# Patient Record
Sex: Male | Born: 1995 | Race: White | Hispanic: No | Marital: Single | Smoking: Never smoker
Health system: Southern US, Community
[De-identification: ages and names within clinical notes are randomized; demographics above are authoritative.]

## PROBLEM LIST (undated history)

## (undated) DIAGNOSIS — F32A Depression, unspecified: Secondary | ICD-10-CM

## (undated) DIAGNOSIS — F329 Major depressive disorder, single episode, unspecified: Secondary | ICD-10-CM

## (undated) HISTORY — DX: Depression, unspecified: F32.A

## (undated) HISTORY — DX: Major depressive disorder, single episode, unspecified: F32.9

---
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
## 2015-06-11 IMAGING — US US ABDOMEN LIMITED SLG ORGAN/ASCITES
1 series · 14 of 25 positions shown · non-contrast
Comparison: none

REASON FOR EXAM: URQ  elevated LFTs
COMMENTS:

[Series 1: us abdomen limited slg organ/ascites · 0.28mm/px · 14 of 69 slices shown]
[im 1/69]
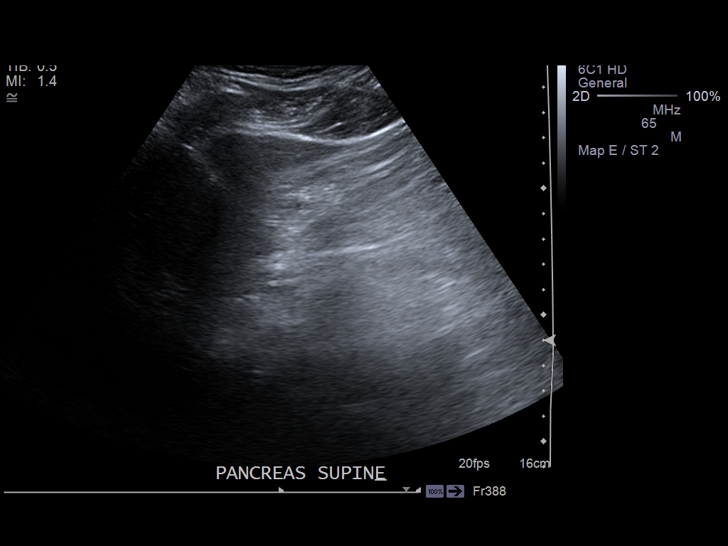
[im 6/69]
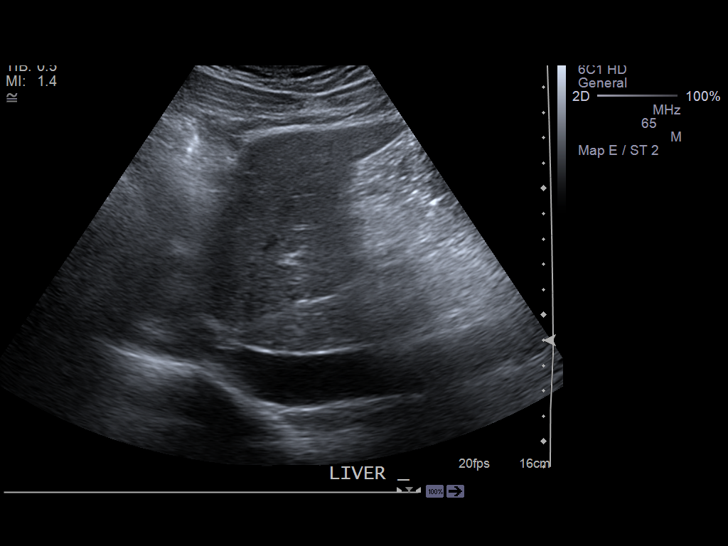
[im 12/69]
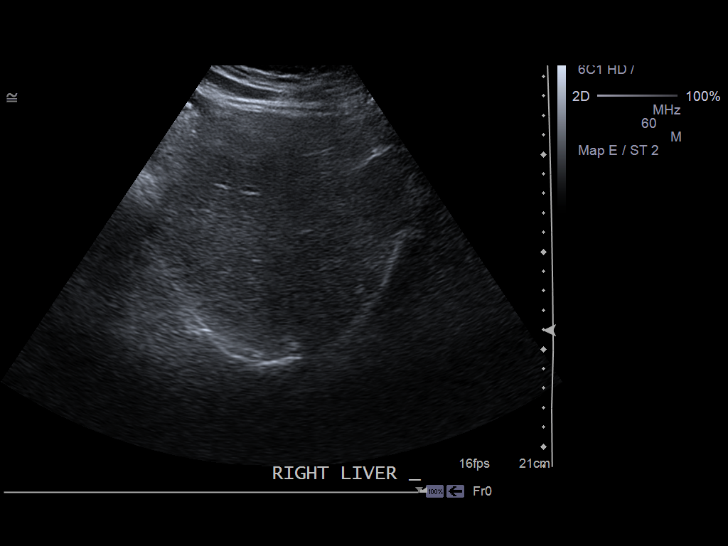
[im 18/69]
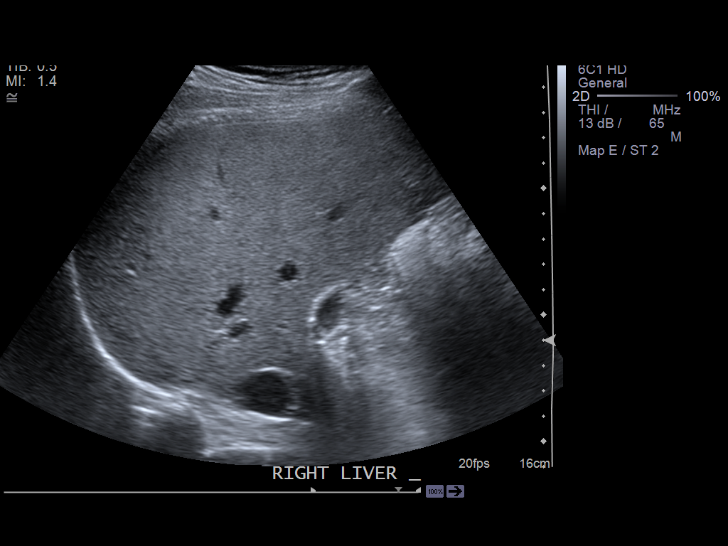
[im 23/69]
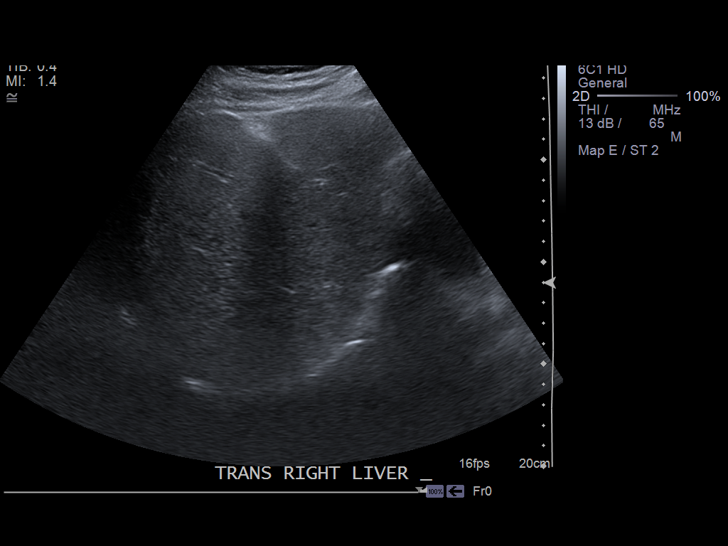
[im 26/69]
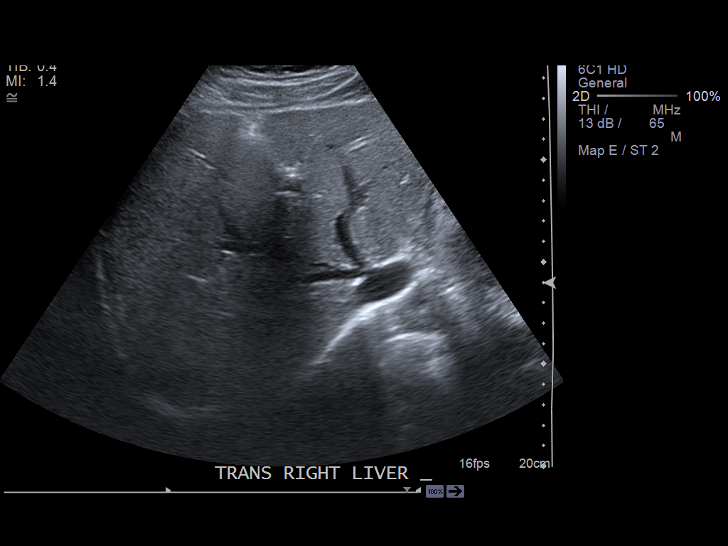
[im 32/69]
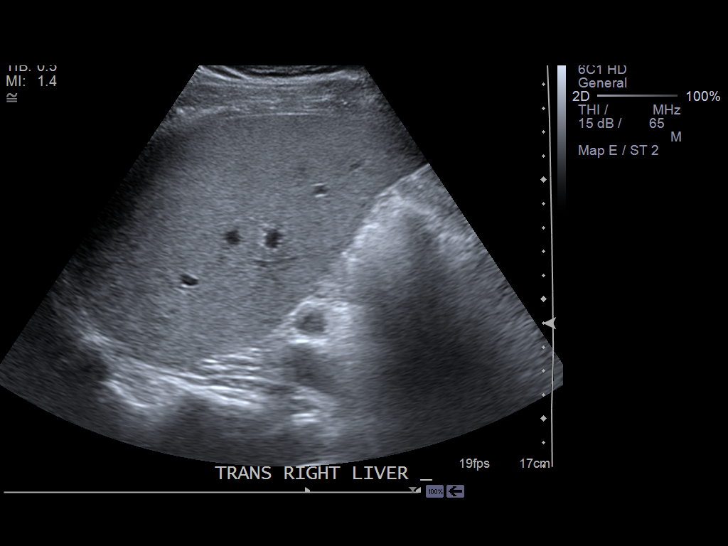
[im 37/69]
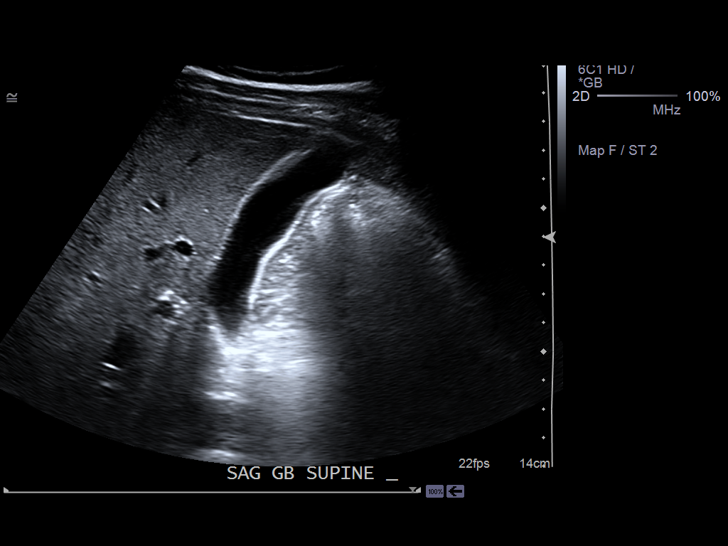
[im 43/69]
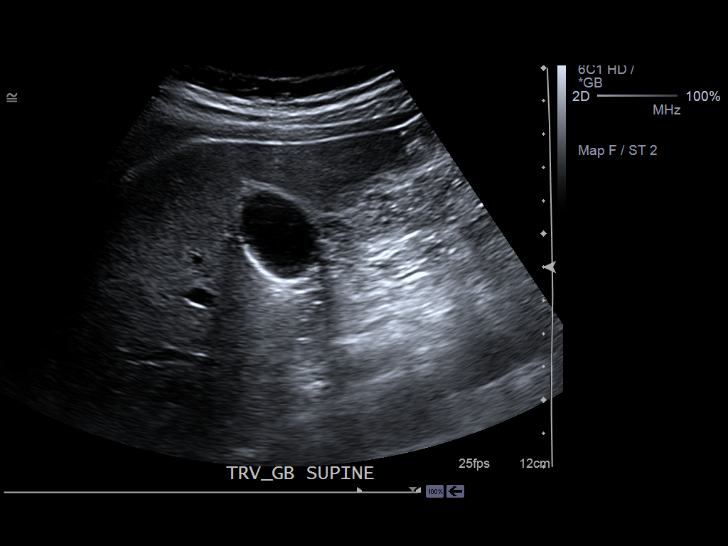
[im 46/69]
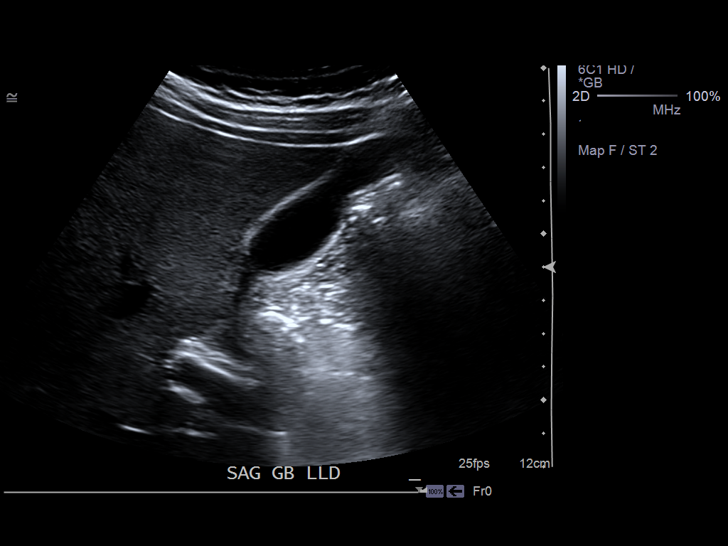
[im 52/69]
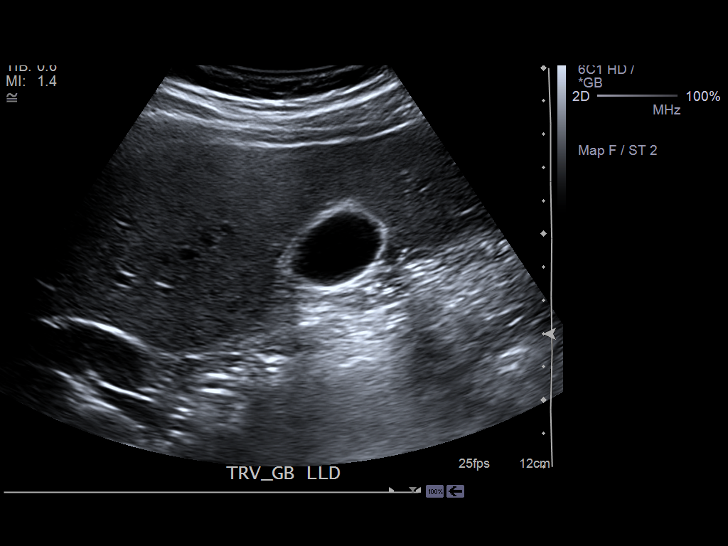
[im 57/69]
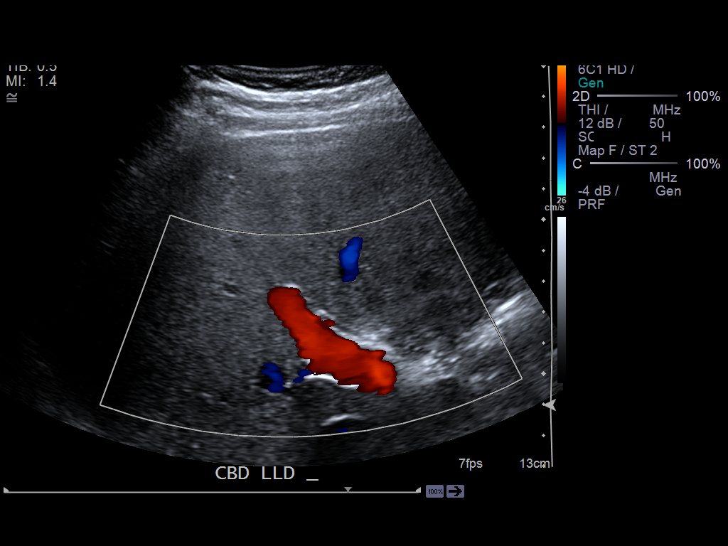
[im 63/69]
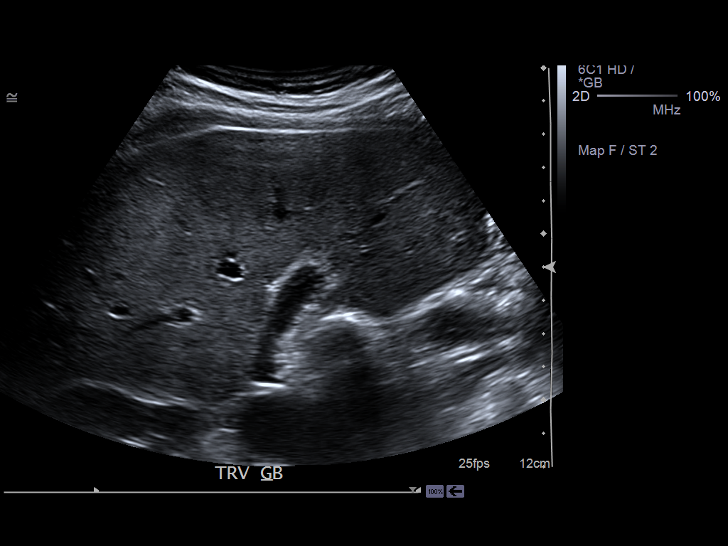
[im 69/69]
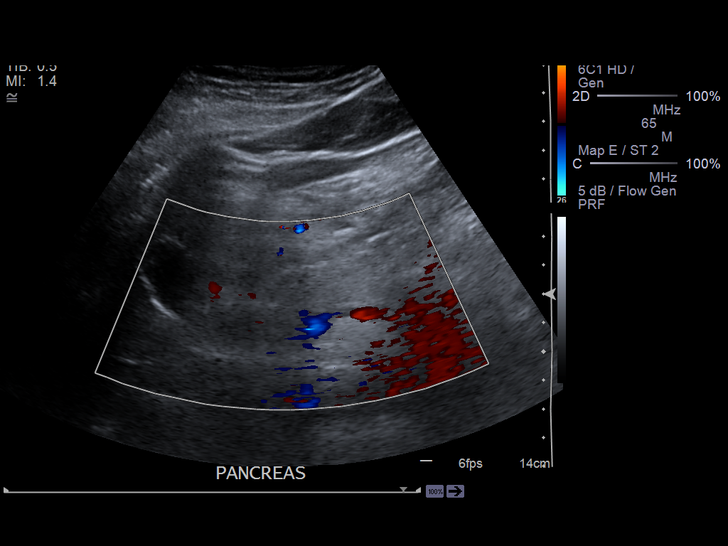

[14 of 25 positions shown; findings below may reference images not displayed]

PROCEDURE:     CEEJAY - CEEJAY ABDOMEN LTD 1 ORGAN OR QUAD  - December 10, 2012  [DATE]

RESULT:     The liver exhibits normal echotexture with no focal mass or
ductal dilation. Portal venous flow is normal in direction toward the liver.
The pancreas was largely obscured by bowel gas. The gallbladder is
adequately distended with no evidence of stones, wall thickening, or
pericholecystic fluid. There is no positive sonographic Murphy's sign.
IMPRESSION: Evaluation the pancreas is limited. Liver, gallbladder, and
common bile duct are normal in appearance.

[REDACTED]

## 2017-03-17 ENCOUNTER — Ambulatory Visit: Payer: Self-pay | Admitting: Family Medicine

## 2017-03-24 ENCOUNTER — Ambulatory Visit: Payer: Self-pay | Admitting: Family Medicine

## 2017-03-27 ENCOUNTER — Ambulatory Visit: Payer: Self-pay | Admitting: Family Medicine

## 2017-04-11 ENCOUNTER — Ambulatory Visit (INDEPENDENT_AMBULATORY_CARE_PROVIDER_SITE_OTHER): Payer: BC Managed Care – PPO | Admitting: Family Medicine

## 2017-04-11 ENCOUNTER — Encounter: Payer: Self-pay | Admitting: Family Medicine

## 2017-04-11 VITALS — BP 133/84 | HR 75 | Temp 98.6°F | Ht 70.0 in | Wt 244.2 lb

## 2017-04-11 DIAGNOSIS — F3130 Bipolar disorder, current episode depressed, mild or moderate severity, unspecified: Secondary | ICD-10-CM

## 2017-04-11 DIAGNOSIS — F319 Bipolar disorder, unspecified: Secondary | ICD-10-CM

## 2017-04-11 DIAGNOSIS — Z7689 Persons encountering health services in other specified circumstances: Secondary | ICD-10-CM

## 2017-04-11 MED ORDER — LAMOTRIGINE 25 MG PO TABS: 25.0000 mg | ORAL_TABLET | Freq: Every day | ORAL | 0 refills | Status: DC

## 2017-04-11 MED ORDER — SERTRALINE HCL 50 MG PO TABS: 50.0000 mg | ORAL_TABLET | Freq: Every day | ORAL | 1 refills | Status: DC

## 2017-04-11 NOTE — Progress Notes (Signed)
BP 133/84 (BP Location: Right Arm, Patient Position: Sitting, Cuff Size: Large)   Pulse 75   Temp 98.6 F (37 C)   Ht 5\' 10"  (1.778 m)   Wt 244 lb 3.2 oz (110.8 kg)   SpO2 98%   BMI 35.04 kg/m    Subjective:    Patient ID: Ricky Duke, male    DOB: 15-Sep-1995, 21 y.o.   MRN: [id number]  HPI: Ricky Duke is a 21 y.o. male  Chief Complaint  Patient presents with  . Establish Care  . Depression    Patient wants to be put back on Zoloft and Lamictal. Filling out PHQ9.   Patient presents today to establish care. Only known medical condition is some bipolar depression that he was diagnosed with in college. Provider at his university had him on zoloft and lamictal and he notes that he did very well on this regimen. Has been off for about a year since graduation as he moved out of state and had not found a new provider. Since being off, has had frequent intense mood swings with long periods of dysphoric moods and anhedonia. No SI/HI, hallucinations, sleep or appetite issues.  Last CPE was several years ago.   Does have strong fhx of bipolar disorder and major depression in several family members.   Relevant past medical, surgical, family and social history reviewed and updated as indicated. Interim medical history since our last visit reviewed. Allergies and medications reviewed and updated.  Review of Systems  Constitutional: Negative.   HENT: Negative.   Respiratory: Negative.   Cardiovascular: Negative.   Gastrointestinal: Negative.   Genitourinary: Negative.   Musculoskeletal: Negative.   Neurological: Negative.   Psychiatric/Behavioral: Positive for dysphoric mood. The patient is hyperactive.    Per HPI unless specifically indicated above     Objective:    BP 133/84 (BP Location: Right Arm, Patient Position: Sitting, Cuff Size: Large)   Pulse 75   Temp 98.6 F (37 C)   Ht 5\' 10"  (1.778 m)   Wt 244 lb 3.2 oz (110.8 kg)   SpO2 98%   BMI 35.04 kg/m   Wt  Readings from Last 3 Encounters:  04/11/17 244 lb 3.2 oz (110.8 kg)    Physical Exam  Constitutional: He is oriented to person, place, and time. He appears well-developed and well-nourished. No distress.  HENT:  Head: Atraumatic.  Eyes: Pupils are equal, round, and reactive to light. Conjunctivae are normal.  Neck: Normal range of motion. Neck supple.  Cardiovascular: Normal rate and normal heart sounds.   Pulmonary/Chest: Effort normal and breath sounds normal. No respiratory distress.  Musculoskeletal: Normal range of motion.  Neurological: He is alert and oriented to person, place, and time.  Skin: Skin is warm and dry.  Psychiatric: He has a normal mood and affect. His behavior is normal. Judgment and thought content normal.  Nursing note and vitals reviewed.   Results for orders placed or performed in visit on 03/12/17  [facility] SCREENING LAB  Result Value Ref Range   [facility] HIV Screening Negative - Validated   [facility] HEPATITIS C SCREENING LAB  Result Value Ref Range   [facility] Hepatitis Screen Negative-Validated       Assessment & Plan:   Problem List Items Addressed This Visit      Other   Bipolar depression (HCC) - Primary    Will restart zoloft and lamictal and slowly titrate doses for max benefit. List of counselors given, strongly recommended also establishing with  one of them. Discussed what to do if feeling urges to harm self or others, including ER, Crisis Center hotlines, [facility] walk-in care. Follow up in 1 month for recheck       Other Visit Diagnoses    Encounter to establish care           Follow up plan: Return in about 4 weeks (around 05/09/2017) for Depression f/u.

## 2017-04-11 NOTE — Assessment & Plan Note (Signed)
Will restart zoloft and lamictal and slowly titrate doses for max benefit. List of counselors given, strongly recommended also establishing with one of them. Discussed what to do if feeling urges to harm self or others, including ER, Crisis Center hotlines, [facility] walk-in care. Follow up in 1 month for recheck

## 2017-04-11 NOTE — Patient Instructions (Signed)
Follow up in 1 month   

## 2017-05-09 ENCOUNTER — Other Ambulatory Visit: Payer: Self-pay

## 2017-05-09 ENCOUNTER — Ambulatory Visit: Payer: BC Managed Care – PPO | Admitting: Family Medicine

## 2017-05-09 ENCOUNTER — Encounter: Payer: Self-pay | Admitting: Family Medicine

## 2017-05-09 VITALS — BP 130/88 | HR 83 | Temp 98.3°F | Wt 244.0 lb

## 2017-05-09 DIAGNOSIS — F319 Bipolar disorder, unspecified: Secondary | ICD-10-CM

## 2017-05-09 DIAGNOSIS — J069 Acute upper respiratory infection, unspecified: Secondary | ICD-10-CM | POA: Diagnosis not present

## 2017-05-09 DIAGNOSIS — F3130 Bipolar disorder, current episode depressed, mild or moderate severity, unspecified: Secondary | ICD-10-CM

## 2017-05-09 DIAGNOSIS — B9789 Other viral agents as the cause of diseases classified elsewhere: Secondary | ICD-10-CM

## 2017-05-09 MED ORDER — SERTRALINE HCL 100 MG PO TABS: 100.0000 mg | ORAL_TABLET | Freq: Every day | ORAL | 3 refills | Status: DC

## 2017-05-09 MED ORDER — LAMOTRIGINE 25 MG PO TABS: 25.0000 mg | ORAL_TABLET | Freq: Two times a day (BID) | ORAL | 0 refills | Status: DC

## 2017-05-09 NOTE — Patient Instructions (Signed)
Follow up in 1 month   

## 2017-05-09 NOTE — Assessment & Plan Note (Signed)
Will increase lamictal to 25 mg BID and zoloft to 100 mg QD and monitor closely for adverse effects. Recheck in 1 month to assess dosing

## 2017-05-09 NOTE — Progress Notes (Addendum)
   BP 130/88   Pulse 83   Temp 98.3 F (36.8 C)   Wt 244 lb (110.7 kg)   SpO2 97%   BMI 35.01 kg/m    Subjective:    Patient ID: Ricky Duke, male    DOB: 1995/07/06, 21 y.o.   MRN: [id number]  HPI: Ricky Jarvisicholas Nuno is a 21 y.o. male  Chief Complaint  Patient presents with  . Depression    follow up, doing well with his meds   Patient presents today for bipolar depression follow up. Was restarted on zoloft and lamictal at low doses last month as he's noticed major mood swings and depressive periods since being off medications. Has not yet titrated to twice daily on the lamictal, still taking 25 mg daily. Doing well, no side effects noted but still having some less severe mood swings. Denies SI/HI, hallucinations, manic behavior.   Also c/o 2-3 days of cold sxs including rhinorrhea, sore throat, and dry cough. Has not been trying anything OTC at this time. Denies wheezing, CP, SOB, fevers, body aches. Several sick contacts.   Relevant past medical, surgical, family and social history reviewed and updated as indicated. Interim medical history since our last visit reviewed. Allergies and medications reviewed and updated.  Review of Systems  Constitutional: Negative.   HENT: Negative.   Respiratory: Negative.   Cardiovascular: Negative.   Gastrointestinal: Negative.   Genitourinary: Negative.   Musculoskeletal: Negative.   Neurological: Negative.   Psychiatric/Behavioral: Positive for agitation and dysphoric mood.    Per HPI unless specifically indicated above     Objective:    BP 130/88   Pulse 83   Temp 98.3 F (36.8 C)   Wt 244 lb (110.7 kg)   SpO2 97%   BMI 35.01 kg/m   Wt Readings from Last 3 Encounters:  05/09/17 244 lb (110.7 kg)  04/11/17 244 lb 3.2 oz (110.8 kg)    Physical Exam  Constitutional: He is oriented to person, place, and time. He appears well-developed and well-nourished.  HENT:  Head: Atraumatic.  Eyes: Conjunctivae are normal. Pupils  are equal, round, and reactive to light.  Neck: Normal range of motion. Neck supple.  Cardiovascular: Normal rate and normal heart sounds.  Pulmonary/Chest: Effort normal and breath sounds normal. No respiratory distress.  Musculoskeletal: Normal range of motion.  Neurological: He is alert and oriented to person, place, and time.  Skin: Skin is warm and dry.  Psychiatric: He has a normal mood and affect. His behavior is normal.  Vitals reviewed.     Assessment & Plan:   Problem List Items Addressed This Visit      Other   Bipolar depression (HCC) - Primary    Will increase lamictal to 25 mg BID and zoloft to 100 mg QD and monitor closely for adverse effects. Recheck in 1 month to assess dosing       Other Visit Diagnoses    Viral URI with cough       OTC remedies and supportive care reviewed. Follow up if worsening or no improvement over the course of the next week       Follow up plan: Return in about 4 weeks (around 06/06/2017) for Depression f/u.

## 2017-05-10 NOTE — Addendum Note (Signed)
Addended by: Roosvelt MaserLANE, Mikel Hardgrove E on: 05/10/2017 11:06 AM   Modules accepted: Level of Service

## 2017-05-22 ENCOUNTER — Telehealth: Payer: Self-pay | Admitting: Family Medicine

## 2017-05-22 NOTE — Telephone Encounter (Signed)
Copied from CRM [id number]. Topic: Quick Communication - See Telephone Encounter >> May 22, 2017  1:57 PM Terisa Starraylor, Brittany L wrote: CRM for notification. See Telephone encounter for:  Pt states that the LAMICTAL and ZOLOFT is not working for him. Patient requested that his dr called him back [phone]  05/22/17.

## 2017-05-23 MED ORDER — OLANZAPINE 5 MG PO TABS: 5.0000 mg | ORAL_TABLET | Freq: Every day | ORAL | 0 refills | Status: DC

## 2017-05-23 NOTE — Telephone Encounter (Signed)
Message given to taper off Zoloft and start Zyprexa as prescribed. Pt to keep 06/06/17 appt.

## 2017-05-23 NOTE — Telephone Encounter (Signed)
LVM for patient to return phone call. [facility]

## 2017-05-23 NOTE — Telephone Encounter (Signed)
Please call pt and let him know I have sent over a different medicine (zyprexa) for him to take with the lamictal - can slowly taper off the zoloft (cut in half, then after a few days start every other day, so on). He can reschedule his upcoming appt for 1 month from start of new medicine so we can have a better idea of efficacy or he can keep that upcoming appt as a check in

## 2017-06-06 ENCOUNTER — Ambulatory Visit: Payer: BC Managed Care – PPO | Admitting: Family Medicine

## 2017-06-06 DIAGNOSIS — F3130 Bipolar disorder, current episode depressed, mild or moderate severity, unspecified: Secondary | ICD-10-CM

## 2017-06-06 DIAGNOSIS — F319 Bipolar disorder, unspecified: Secondary | ICD-10-CM

## 2017-06-06 MED ORDER — OLANZAPINE 10 MG PO TABS: 10.0000 mg | ORAL_TABLET | Freq: Every day | ORAL | 0 refills | Status: DC

## 2017-06-06 MED ORDER — LAMOTRIGINE 100 MG PO TABS: 50.0000 mg | ORAL_TABLET | Freq: Two times a day (BID) | ORAL | 0 refills | Status: DC

## 2017-06-06 NOTE — Progress Notes (Signed)
   There were no vitals taken for this visit.   Subjective:    Patient ID: Ricky Duke, male    DOB: 08-30-1995, 21 y.o.   MRN: [id number]  HPI: Ricky Jarvisicholas Bouch is a 21 y.o. male  Chief Complaint  Patient presents with  . Follow-up  . mood   Patient here for f/u bipolar depression. Tapering off zoloft as no benefit and started lamictal and zyprexa about 2 weeks ago as directed. No side effects noted, maybe some mild improvement but still very much having quick agitation and mood swings. No SI/HI, does not feel he is a danger just is exhausted of the moods.   Relevant past medical, surgical, family and social history reviewed and updated as indicated. Interim medical history since our last visit reviewed. Allergies and medications reviewed and updated.  Review of Systems  Constitutional: Negative.   HENT: Negative.   Respiratory: Negative.   Cardiovascular: Negative.   Gastrointestinal: Negative.   Musculoskeletal: Negative.   Neurological: Negative.   Psychiatric/Behavioral: Positive for agitation and dysphoric mood. The patient is hyperactive.    Per HPI unless specifically indicated above     Objective:    There were no vitals taken for this visit.  Wt Readings from Last 3 Encounters:  05/09/17 244 lb (110.7 kg)  04/11/17 244 lb 3.2 oz (110.8 kg)    Physical Exam  Constitutional: He is oriented to person, place, and time. He appears well-developed and well-nourished. No distress.  HENT:  Head: Atraumatic.  Eyes: Conjunctivae are normal. Pupils are equal, round, and reactive to light. No scleral icterus.  Neck: Normal range of motion. Neck supple.  Cardiovascular: Normal rate and normal heart sounds.  Pulmonary/Chest: Effort normal and breath sounds normal. No respiratory distress.  Musculoskeletal: Normal range of motion.  Lymphadenopathy:    He has no cervical adenopathy.  Neurological: He is alert and oriented to person, place, and time.  Skin: Skin is warm  and dry.  Psychiatric: Judgment and thought content normal.  Flat affect  Nursing note and vitals reviewed.     Assessment & Plan:   Problem List Items Addressed This Visit      Other   Bipolar depression (HCC) - Primary    Will increase both lamictal and zyprexa and monitor closely for benefit. Pt still not interested in counseling. Continue taper off zoloft, is nearly completely off of the medication now. F/u in 1 month, sooner if needed          Follow up plan: Return in about 4 weeks (around 07/04/2017) for Mood f/u.

## 2017-06-08 ENCOUNTER — Encounter: Payer: Self-pay | Admitting: Family Medicine

## 2017-06-08 NOTE — Patient Instructions (Signed)
Follow up as needed

## 2017-06-08 NOTE — Assessment & Plan Note (Signed)
Will increase both lamictal and zyprexa and monitor closely for benefit. Pt still not interested in counseling. Continue taper off zoloft, is nearly completely off of the medication now. F/u in 1 month, sooner if needed

## 2017-06-09 ENCOUNTER — Other Ambulatory Visit: Payer: Self-pay | Admitting: Family Medicine

## 2017-06-24 ENCOUNTER — Other Ambulatory Visit: Payer: Self-pay | Admitting: Family Medicine

## 2017-07-04 ENCOUNTER — Encounter: Payer: Self-pay | Admitting: Family Medicine

## 2017-07-04 ENCOUNTER — Ambulatory Visit: Payer: BC Managed Care – PPO | Admitting: Family Medicine

## 2017-07-04 VITALS — BP 123/80 | HR 90 | Temp 97.6°F | Wt 250.2 lb

## 2017-07-04 DIAGNOSIS — F3130 Bipolar disorder, current episode depressed, mild or moderate severity, unspecified: Secondary | ICD-10-CM | POA: Diagnosis not present

## 2017-07-04 DIAGNOSIS — F319 Bipolar disorder, unspecified: Secondary | ICD-10-CM

## 2017-07-04 MED ORDER — OLANZAPINE 10 MG PO TABS: 10.0000 mg | ORAL_TABLET | Freq: Every day | ORAL | 1 refills | Status: DC

## 2017-07-04 MED ORDER — LAMOTRIGINE 100 MG PO TABS: 100.0000 mg | ORAL_TABLET | Freq: Two times a day (BID) | ORAL | 2 refills | Status: DC

## 2017-07-04 NOTE — Progress Notes (Signed)
   BP 123/80 (BP Location: Right Arm, Patient Position: Sitting, Cuff Size: Normal)   Pulse 90   Temp 97.6 F (36.4 C) (Temporal)   Wt 250 lb 3.2 oz (113.5 kg)   SpO2 96%   BMI 35.90 kg/m    Subjective:    Patient ID: Ricky Duke, male    DOB: 01/18/1996, 22 y.o.   MRN: [id number]  HPI: Ricky Duke is a 22 y.o. male  Chief Complaint  Patient presents with  . Depression    Patient states no complaints.    Pt here for bipolar depression f/u. Lamictal and zyprexa increased at last visit. Still having some issues with agitation but does feel like things have gotten better. No SI/HI, hallucinations. Denies side effects. Still not interested in counseling of any sort, states he doesn't have time.   [facility]04/2017 06/06/2017 05/09/2017  Decreased Interest 1 2 1   Down, Depressed, Hopeless 2 2 2   PHQ - 2 Score 3 4 3   Altered sleeping 1 0 0  Tired, decreased energy 2 1 3   Change in appetite 2 0 0  Feeling bad or failure about yourself  2 2 2   Trouble concentrating 1 1 2   Moving slowly or fidgety/restless 2 3 3   Suicidal thoughts 1 2 2   PHQ-9 Score 14 13 15     Relevant past medical, surgical, family and social history reviewed and updated as indicated. Interim medical history since our last visit reviewed. Allergies and medications reviewed and updated.  Review of Systems  Constitutional: Negative.   Respiratory: Negative.   Cardiovascular: Negative.   Gastrointestinal: Negative.   Genitourinary: Negative.   Musculoskeletal: Negative.   Neurological: Negative.   Psychiatric/Behavioral: Positive for agitation.   Per HPI unless specifically indicated above     Objective:    BP 123/80 (BP Location: Right Arm, Patient Position: Sitting, Cuff Size: Normal)   Pulse 90   Temp 97.6 F (36.4 C) (Temporal)   Wt 250 lb 3.2 oz (113.5 kg)   SpO2 96%   BMI 35.90 kg/m   Wt Readings from Last 3 Encounters:  07/04/17 250 lb 3.2 oz (113.5 kg)  05/09/17  244 lb (110.7 kg)  04/11/17 244 lb 3.2 oz (110.8 kg)    Physical Exam  Constitutional: He is oriented to person, place, and time. He appears well-developed and well-nourished. No distress.  HENT:  Head: Atraumatic.  Eyes: Conjunctivae are normal. Pupils are equal, round, and reactive to light.  Neck: Normal range of motion. Neck supple.  Cardiovascular: Normal rate and normal heart sounds.  Pulmonary/Chest: Effort normal and breath sounds normal.  Abdominal: Soft. Bowel sounds are normal.  Musculoskeletal: Normal range of motion.  Neurological: He is alert and oriented to person, place, and time.  Skin: Skin is warm and dry.  Psychiatric: He has a normal mood and affect. His behavior is normal.  Nursing note and vitals reviewed.     Assessment & Plan:   Problem List Items Addressed This Visit      Other   Bipolar depression (HCC) - Primary    Improved, but pt still does not feel like things are where he wants them to be. Will increase lamictal and monitor closely for benefit. F/u in 2 months unless issues in meantime. Continue to encourage counseling.           Follow up plan: Return in about 8 weeks (around 08/29/2017) for Mood f/u.

## 2017-07-06 NOTE — Patient Instructions (Signed)
Follow up as needed

## 2017-07-06 NOTE — Assessment & Plan Note (Signed)
Improved, but pt still does not feel like things are where he wants them to be. Will increase lamictal and monitor closely for benefit. F/u in 2 months unless issues in meantime. Continue to encourage counseling.

## 2017-07-08 ENCOUNTER — Other Ambulatory Visit: Payer: Self-pay | Admitting: Family Medicine

## 2017-07-10 ENCOUNTER — Telehealth: Payer: Self-pay

## 2017-07-10 MED ORDER — LAMOTRIGINE 100 MG PO TABS: 100.0000 mg | ORAL_TABLET | Freq: Two times a day (BID) | ORAL | 1 refills | Status: DC

## 2017-07-10 NOTE — Telephone Encounter (Signed)
90 day supply request for lamotrigine 100mg .

## 2017-07-10 NOTE — Telephone Encounter (Signed)
Rx sent 

## 2017-08-29 ENCOUNTER — Telehealth: Payer: Self-pay | Admitting: Family Medicine

## 2017-08-29 MED ORDER — OLANZAPINE 10 MG PO TABS: 10.0000 mg | ORAL_TABLET | Freq: Every day | ORAL | 0 refills | Status: DC

## 2017-08-29 NOTE — Telephone Encounter (Signed)
[facility]  Olanzapine 5 mg 90 day supply  Thank you  [facility] [phone]

## 2017-08-29 NOTE — Telephone Encounter (Signed)
Pt now on 10 mg zyprexa, sent [facility] supply to [facility]

## 2017-09-02 ENCOUNTER — Encounter: Payer: Self-pay | Admitting: Family Medicine

## 2017-09-02 ENCOUNTER — Ambulatory Visit: Payer: BC Managed Care – PPO | Admitting: Family Medicine

## 2017-09-02 VITALS — BP 124/83 | HR 75 | Temp 97.7°F | Wt 242.9 lb

## 2017-09-02 DIAGNOSIS — F3130 Bipolar disorder, current episode depressed, mild or moderate severity, unspecified: Secondary | ICD-10-CM

## 2017-09-02 DIAGNOSIS — F319 Bipolar disorder, unspecified: Secondary | ICD-10-CM

## 2017-09-02 MED ORDER — FLUOXETINE HCL 20 MG PO TABS: 20.0000 mg | ORAL_TABLET | Freq: Every day | ORAL | 3 refills | Status: DC

## 2017-09-02 NOTE — Progress Notes (Signed)
BP 124/83 (BP Location: Right Arm, Patient Position: Sitting, Cuff Size: Normal)   Pulse 75   Temp 97.7 F (36.5 C) (Oral)   Wt 242 lb 14.4 oz (110.2 kg)   SpO2 96%   BMI 34.85 kg/m    Subjective:    Patient ID: Ricky Duke, male    DOB: 02/09/1996, 22 y.o.   MRN: [id number]  HPI: Ricky Duke is a 22 y.o. male  Chief Complaint  Patient presents with  . Depression    Patient states things are about the same.   Pt here today for bipolar depression/agitation f/u. Currently taking 100 mg lamictal and 5 mg zyprexa - was supposed to be on 10 mg but pharmacy has still been giving him the 5's so he has not yet started the increased dose. No side effects, but has not felt much improvement since last visit. Still getting very easily agitation but feels mood swings are under decent control and his baseline moods are a bit better than previous. Denies SI/HI, hallucinations, paranoia, sleep or appetite concerns.   Depression screen [facility] 2/9 09/02/2017 07/04/2017 06/06/2017  Decreased Interest 1 1 2   Down, Depressed, Hopeless 1 2 2   PHQ - 2 Score 2 3 4   Altered sleeping 0 1 0  Tired, decreased energy 1 2 1   Change in appetite 0 2 0  Feeling bad or failure about yourself  1 2 2   Trouble concentrating 1 1 1   Moving slowly or fidgety/restless 1 2 3   Suicidal thoughts 1 1 2   PHQ-9 Score 7 14 13     Relevant past medical, surgical, family and social history reviewed and updated as indicated. Interim medical history since our last visit reviewed. Allergies and medications reviewed and updated.  Review of Systems  Per HPI unless specifically indicated above     Objective:    BP 124/83 (BP Location: Right Arm, Patient Position: Sitting, Cuff Size: Normal)   Pulse 75   Temp 97.7 F (36.5 C) (Oral)   Wt 242 lb 14.4 oz (110.2 kg)   SpO2 96%   BMI 34.85 kg/m   Wt Readings from Last 3 Encounters:  09/02/17 242 lb 14.4 oz (110.2 kg)  07/04/17 250 lb 3.2 oz (113.5 kg)  05/09/17  244 lb (110.7 kg)    Physical Exam  Constitutional: He is oriented to person, place, and time. He appears well-developed and well-nourished. No distress.  HENT:  Head: Atraumatic.  Eyes: Pupils are equal, round, and reactive to light. Conjunctivae are normal.  Neck: Normal range of motion.  Cardiovascular: Normal rate and normal heart sounds.  Pulmonary/Chest: Effort normal and breath sounds normal.  Musculoskeletal: Normal range of motion.  Neurological: He is alert and oriented to person, place, and time.  Skin: Skin is warm and dry.  Psychiatric: He has a normal mood and affect. His behavior is normal.  Nursing note and vitals reviewed.   Results for orders placed or performed in visit on 03/12/17  [facility] SCREENING LAB  Result Value Ref Range   [facility] HIV Screening Negative - Validated   [facility] HEPATITIS C SCREENING LAB  Result Value Ref Range   HM Hepatitis Screen Negative-Validated       Assessment & Plan:   Problem List Items Addressed This Visit      Other   Bipolar depression (HCC) - Primary    Stable from previous, no improvement in agitation per patient. Will start the 10 mg zyprexa and add prozac under both current medications,  monitor closely for benefit. Again strongly encouraged pt to seek counseling, pt states he does not have time for that.           Follow up plan: Return in about 3 months (around 12/03/2017) for Mood f/u.

## 2017-09-04 NOTE — Patient Instructions (Signed)
Follow up as scheduled.  

## 2017-09-04 NOTE — Assessment & Plan Note (Signed)
Stable from previous, no improvement in agitation per patient. Will start the 10 mg zyprexa and add prozac under both current medications, monitor closely for benefit. Again strongly encouraged pt to seek counseling, pt states he does not have time for that.

## 2017-09-25 ENCOUNTER — Telehealth: Payer: Self-pay

## 2017-09-25 MED ORDER — FLUOXETINE HCL 20 MG PO TABS: 20.0000 mg | ORAL_TABLET | Freq: Every day | ORAL | 0 refills | Status: DC

## 2017-09-25 NOTE — Telephone Encounter (Signed)
Pharmacy sent a fax wanting to know if patient's fluoxetine can be changed to 90 day RX instead of 30.

## 2017-09-25 NOTE — Telephone Encounter (Signed)
[facility] [place]  Advanced Heart Failure and Transplant  Heart Transplant Clinic  Follow-up Visit    Primary Care Physician: Brodsky, Mark E  Referring Provider: Brett Justin Berman  Date of Transplant: 08/06/2019  Organ(s) Transplanted: heart  Indication for transplant: Dilated Myopathy: Idiopathic  PHS increased risk donor: Yes    ID. 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    Interval History:    The patient was last seen on 10/01/21. At that time issues with pain after urologic procedure.    He continues to deal with pain issue largely from prostate surgery. He still has some bleeding and some tissue come out. He tried different strategies and nothing helped. This is really impacting quality of life. He gets tired and frustrated and does not want to take it out on his family.    ROS:  A complete ROS was performed and is negative except as documented in the HPI.      Allergies:  Patient is allergic to cats [other] and dogs [other].    Past Medical History:   Diagnosis Date    Asthma     Atrial fibrillation (CMS-HCC)     Chronic HFrEF (heart failure with reduced ejection fraction) (CMS-HCC)     GERD (gastroesophageal reflux disease)     HTN (hypertension)     Insomnia     Nephrolithiasis     Sinusitis      Patient Active Problem List   Diagnosis    COPD (chronic obstructive pulmonary disease) (CMS-HCC)    Heart transplant, orthotopic, 08/06/2019    Pericardial effusion    Hypertension    Chronic back pain    At risk for infection transmitted from donor    Acute hepatitis C virus infection    Heart transplanted (CMS-HCC)    Acute UTI    Umbilical hernia without obstruction and without gangrene    COVID-19 virus detected    Acute medial meniscus tear of left knee, sequela    Localized osteoarthritis of left knee     Past Surgical History:   Procedure Laterality Date    CARDIAC DEFIBRILLATOR PLACEMENT       PB ANESTH,SHOULDER JOINT,NOS Right      Family History   Problem Relation Name Age of Onset    Hypertension Other      Other Maternal Grandmother          kidney disease needing HD     Social History     Socioeconomic History    Marital status: Single     Spouse name: Not on file    Number of children: Not on file    Years of education: Not on file    Highest education level: Not on file   Occupational History    Not on file   Tobacco Use    Smoking status: Never    Smokeless tobacco: Never    Tobacco comments:     from friends and relatives    Substance and Sexual Activity    Alcohol use: Not Currently     Comment: Prior heavier use, but completely quit in 2016    Drug use: Yes     Comment: eats edible marijuana for pain and insomnia     Sexual activity: Not on file   Other Topics Concern    Not on file   Social History Narrative      Born in [place], also lived in [place], [place], [place], no travel, worked as a carpenter, occasional cedar, no birds, no hot tubs, worked in construction + possible asbestos exposure      Social Determinants of Health     Financial Resource Strain: Not on file   Food Insecurity: Not on file   Transportation Needs: Not on file   Physical Activity: Not on file   Stress: Not on file   Social Connections: Not on file   Intimate Partner Violence: Not on file   Housing Stability: Not on file     Current Outpatient Medications   Medication Sig    albuterol 108 (90 Base) MCG/ACT inhaler Inhale 2 puffs by mouth every 4 hours as needed for Wheezing or Shortness of Breath.    aspirin 81 MG EC tablet Take 1 tablet (81 mg) by mouth daily.    baclofen (LIORESAL) 10 MG tablet Take 2 tablets (20 mg) by mouth nightly.    Blood Glucose Monitoring Suppl (TRUE METRIX METER) w/Device KIT Use as directed    budesonide-formoterol (SYMBICORT) 160-4.5 MCG/ACT inhaler Inhale 2 puffs by mouth every 12 hours.    bumetanide (BUMEX) 1 MG tablet Take 1 tablet (1 mg) by mouth daily as needed (fluid/weight  gain). Do not take unless instructed by Transplant team.    Calcium Carb-Cholecalciferol 600-10 MG-MCG TABS Take 1 tablet by mouth 2 times daily.    Cetirizine HCl (ZERVIATE) 0.24 % SOLN Place 1 drop into both eyes 2 times daily.    clindamycin (CLEOCIN T) 1 % solution Apply 1 Application. topically 2 times daily. Apply to the red bumps on your face up to two times a day.    controlled substance agreement controlled substance agreement    diclofenac (VOLTAREN) 1 % gel Apply 2 g topically 4 times daily.    docusate sodium (COLACE) 100 MG capsule Take 1 capsule (100 mg) by mouth 2 times daily.    DULoxetine (CYMBALTA) 30 MG CR capsule Take 1 capsule (30 mg) by mouth daily.    famotidine (PEPCID) 20 MG tablet Take 1 tablet (20 mg) by mouth 2 times daily.    fluticasone propionate (FLONASE) 50 MCG/ACT nasal spray Spray 1 spray into each nostril 2 times daily.    gabapentin (NEURONTIN) 300 MG capsule Take 1 capsule (300 mg) by mouth every morning AND 1 capsule (300 mg) daily AND 2 capsules (600 mg) every evening.    hydroCHLOROthiazide (HYDRODIURIL) 25 MG tablet Take 1 tablet (25 mg) by mouth daily.    ketoconazole (NIZORAL) 2 % shampoo Use shampoo daily for dandruff    lidocaine (LIDOCAINE PAIN RELIEF) 4 % patch Apply 1 patch topically every 24 hours. Leave patch on for 12 hours, then remove for 12 hours.    lisinopril (PRINIVIL, ZESTRIL) 10 MG tablet Take 2 tablets (20 mg) by mouth daily.    magnesium oxide (MAG-OX) 400 MG tablet Take 1 tablet by mouth daily    melatonin (GNP MELATONIN MAXIMUM STRENGTH) 5 MG tablet Take 2 tablets (10 mg) by mouth at bedtime.    Multiple Vitamin (MULTIVITAMIN) TABS tablet Take 1 tablet by mouth daily.    naloxone (KLOXXADO) 8 mg/0.1 mL nasal spray Call 911! Tilt head and spray intranasally into one nostril as needed for respiratory depression. If patient does not respond or responds and then relapses, repeat using a new nasal spray every 3 minutes until emergency medical assistance  arrives.    NEEDLE, DISP, 25 G 25G X   1" MISC Use to inject testosterone    NIFEdipine (ADALAT CC) 30 MG Controlled-Release tablet Take 1 tablet (30 mg) by mouth nightly.    ondansetron (ZOFRAN) 8 MG tablet Take 1 tablet (8 mg) by mouth every 8 hours as needed for Nausea/Vomiting.    oxyCODONE (ROXICODONE) 10 MG tablet Take 1 tab every 4 hours as needed for moderate pain and 2 tabs every 4 hours as needed for severe pain. Max 10 tabs per day, 28 day supply    phenazopyridine (PYRIDIUM) 100 MG tablet Take 1 tablet (100 mg) by mouth 3 times daily.    polyethylene glycol (GLYCOLAX) 17 GM/SCOOP powder Mix 17 grams in 4-8 oz of liquide and drink by mouth daily as needed (Constipation).    pravastatin (PRAVACHOL) 40 MG tablet Take 1 tablet (40 mg) by mouth every evening.    senna (SENOKOT) 8.6 MG tablet Take 1 tablet (8.6 mg) by mouth daily.    sirolimus (RAPAMUNE) 1 MG tablet Take 2 tablets (2 mg) by mouth every morning.    SYRINGE-NEEDLE, DISP, 3 ML (B-D 3CC LUER-LOK SYR 25GX1") 25G X 1" 3 ML MISC Use as directed to inject testosterone    SYRINGE-NEEDLE, DISP, 3 ML 18G X 1-1/2" 3 ML MISC Use to draw up testosterone    tacrolimus (ENVARSUS XR) 1 MG tablet STOP TAKING since 02/27/22 - remaining on chart for dose adjustments, titratable med.    tacrolimus (ENVARSUS XR) 4 MG tablet Take 1 tablet (4 mg) by mouth every morning.    tamsulosin (FLOMAX) 0.4 MG capsule Take 1 capsule (0.4 mg) by mouth daily.    tamsulosin (FLOMAX) 0.4 MG capsule Take 1 capsule (0.4 mg) by mouth daily.    testosterone cypionate (DEPO-TESTOSTERONE) 200 MG/ML SOLN Inject 1 ml into the muscle every 14 days    traZODone (DESYREL) 50 MG tablet Take 1 tablet (50 mg) by mouth nightly.     Current Facility-Administered Medications   Medication    diphenhydrAMINE (BENADRYL) injection 50 mg    diphenhydrAMINE (BENADRYL) tablet 50 mg     Immunization History   Administered Date(s) Administered    COVID-19 (Moderna) Low Dose Red Cap >= 18 Years 07/28/2020     COVID-19 (Moderna) Red Cap >= 12 Years 09/04/2019, 10/04/2019, 02/02/2020    Hep-A/Hep-B; Twinrix, Adult 08/28/2020    Influenza Vaccine (High Dose) Quadrivalent >=65 Years 04/19/2020    Influenza Vaccine (Unspecified) 02/15/2017    Influenza Vaccine >=6 Months 04/30/2010, 04/30/2011, 06/25/2012, 03/23/2014, 03/06/2018, 03/22/2019    Pneumococcal 13 Vaccine (PREVNAR-13) 04/19/2020    Pneumococcal 23 Vaccine (PNEUMOVAX-23) 04/30/2013, 08/28/2020    Tdap 06/18/2011   Deferred Date(s) Deferred    Pneumococcal 23 Vaccine (PNEUMOVAX-23) 08/19/2019     Physical Exam:  BP 102/69 (BP Location: Right arm, BP Patient Position: Sitting, BP cuff size: Large)   Pulse 98   Temp 98.5 F (36.9 C) (Temporal)   Resp 16   Ht 5' 10" (1.778 m)   Wt 96.2 kg (212 lb)   SpO2 97%   BMI 30.42 kg/m      General Appearance: ***alert, no distress, pleasant affect, cooperative.  Heart:  JVD ***, PMI ***, normal rate and regular rhythm, no murmurs, clicks, or gallops. ***  Lungs: ***clear to auscultation and percussion. No rales, rhonchi, or wheezes noted. No chest deformities noted.  Abdomen: ***BS normal.  Abdomen soft, non-tender.  No masses or organomegaly.  Extremities:  ***no cyanosis, clubbing, or edema. Has 2+ peripheral pulses.        Lab Data:  Lab Results   Component Value Date    BUN 26 (H) 02/27/2022    CREAT 1.98 (H) 02/27/2022    CL 99 02/27/2022    NA 140 02/27/2022    K 4.4 02/27/2022    CA 9.2 02/27/2022    TBILI 0.47 02/27/2022    ALB 4.1 02/27/2022    TP 7.1 02/27/2022    AST 22 02/27/2022    ALK 76 02/27/2022    BICARB 29 02/27/2022    ALT 25 02/27/2022    GLU 126 (H) 02/27/2022     Lab Results   Component Value Date    WBC 7.9 02/27/2022    RBC 5.50 02/27/2022    HGB 15.2 02/27/2022    HCT 46.5 02/27/2022    MCV 84.5 02/27/2022    MCHC 32.7 02/27/2022    RDW 12.3 02/27/2022    PLT 162 02/27/2022    MPV 11.6 02/27/2022     Lab Results   Component Value Date    A1C 5.7 07/27/2021     Lab Results   Component Value Date     TSH 1.63 07/27/2021     Lab Results   Component Value Date    CHOL 105 07/27/2021    HDL 38 07/27/2021    LDLCALC 43 07/27/2021    TRIG 121 07/27/2021     Lab Results   Component Value Date    SIROT 11.5 02/27/2022     Lab Results   Component Value Date    FKTR 6.5 02/27/2022     No results found for: CSATR  Lab Results   Component Value Date    CMVPL Not Detected 05/18/2021     Lab Results   Component Value Date    DSA ABSENT 02/27/2022       Prior Cardiovascular Studies:   Lab Results   Component Value Date    LV Ejection Fraction 59 08/23/2021          Echo 08/23/21  Summary:   1. The left ventricular size is normal. The left ventricular systolic function is normal.   2. No left ventricular hypertrophy.   3. Normal pattern of left ventricular diastolic filling.   4. EF=59%.   5. Compared to prior study EF now 59%, was 69% 09/15/20.     LHC/IVUS 08/21/21  CONCLUSION:                                                                   1. 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                              2. No angiographic evidence of coronary artery disease.                      3. Intimal thickness noted in LAD/LM up to 0.5 mm (Stable to slightly       worse compare to 2022).                                                         4. Non significant FFR at apical LAD.                                        5. Left ventricular end diastolic pressure appears normal.        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    Assessment/Plan:  # Hematuria  # Dysuria  # Chronic pain  Assessment: We had a long frank discussion about patient's chronic pain issues and the heart transplant team's role in this. I discussed with him that when I initially agreed to cover his chronic opiate prescription, this was the assumption that he would  have a provider versed in chronic pain after 3-4 months, but we are at 6 months and has unable to find one. Additionally, I had not put him on a pain contract at that time, but he recently used more opiates without asking and I informed him this was not appropriate, but because he had not established guidelines I was not going to stop at this time. However, going forward until he can establish with a pain physician, we will set up a pain contract and he will need to follow through like a usual pain clinic with us with goal of provider in 3-4 months or I may start tapering. I will augment adjuvant agents additionally for now and we can continue to work on this.  Plan:  -pain contract signed  -urine tox monthly  -clinic follow up month  -oxycodone 10 mg tablets PO, 1 tab every 4 hours moderate pain, 2 tabs every 4 hours for severe pain, no more than 10 tablets a day, total 280 per 28 days.   -diclofenac cream for joint pain  -lidocaine patch for back pain  -trial of pyridium  -increase gaba at night  -siro change as below  -cymbalta as below    # End-stage heart failure s/p orthotopic heart transplant  # Chronic Immunosuppression/Immunomodulation  Assessment: While we thought continuing sirolimus would help prevent recurrent scar tissue from prostate procedure, it may be exacerbating factors now with delayed wound healing. Will try mmf for 1 month.  Plan:   - continue envarsus 6 mg daily, goal trough 4-8  - HOLD sirolimus 3 mg daily, goal trough 4-8 for at least 1 month  - start mmf 1000 mg bid for one month to allow healing  - Continue to monitor for renal toxicities, infection risk and malignancy risk  - continue pravastatin 40 mg daily  - continue aspirin 81 mg daily    # Hypertension  Assessment: controlled  Plan:  -continue lisinopril 20 mg daily  -resume hctz  -nifedipine 30 mg daily    # Dyslipidemia  -continue pravastatin 40 mg daily    # Depression  Assessment: improved mood  Plan:  -increase cymbalta to 120  mg daily     RTC in 1 month       Beacher W Wettersten, MD  Advanced Heart Failure, Mechanical Circulatory Support, Transplant  Pgr: [phone]

## 2017-10-28 ENCOUNTER — Other Ambulatory Visit: Payer: Self-pay | Admitting: Family Medicine

## 2017-10-28 NOTE — Telephone Encounter (Signed)
Can we refuse medication to close the encounter please?

## 2017-10-28 NOTE — Telephone Encounter (Signed)
Should not be out yet- given 3 month supply in March.

## 2017-12-02 ENCOUNTER — Encounter: Payer: Self-pay | Admitting: Family Medicine

## 2017-12-02 ENCOUNTER — Ambulatory Visit (INDEPENDENT_AMBULATORY_CARE_PROVIDER_SITE_OTHER): Payer: BC Managed Care – PPO | Admitting: Family Medicine

## 2017-12-02 VITALS — BP 106/74 | HR 73 | Temp 98.7°F | Wt 234.3 lb

## 2017-12-02 DIAGNOSIS — F319 Bipolar disorder, unspecified: Secondary | ICD-10-CM | POA: Diagnosis not present

## 2017-12-02 DIAGNOSIS — Z79899 Other long term (current) drug therapy: Secondary | ICD-10-CM

## 2017-12-02 MED ORDER — LAMOTRIGINE 100 MG PO TABS: 100.0000 mg | ORAL_TABLET | Freq: Two times a day (BID) | ORAL | 1 refills | Status: DC

## 2017-12-02 MED ORDER — OLANZAPINE 10 MG PO TABS: 10.0000 mg | ORAL_TABLET | Freq: Every day | ORAL | 1 refills | Status: DC

## 2017-12-02 MED ORDER — FLUOXETINE HCL 20 MG PO TABS: 20.0000 mg | ORAL_TABLET | Freq: Every day | ORAL | 1 refills | Status: DC

## 2017-12-02 NOTE — Assessment & Plan Note (Signed)
Feeling good on current regimen. Does not want to change dose. Will continue current regimen and recheck in 3 months. Call with any concerns.

## 2017-12-02 NOTE — Progress Notes (Signed)
BP 106/74 (BP Location: Left Arm, Patient Position: Sitting, Cuff Size: Large)   Pulse 73   Temp 98.7 F (37.1 C)   Wt 234 lb 5 oz (106.3 kg)   SpO2 98%   BMI 33.62 kg/m    Subjective:    Patient ID: Ricky Duke, male    DOB: 1995-11-14, 22 y.o.   MRN: [id number]  HPI: Ricky Jarvisicholas Rodriques is a 22 y.o. male  Chief Complaint  Patient presents with  . Manic Behavior   BIPOLAR- still irritable, but feeling a lot better. Mood status: controlled Satisfied with current treatment?: yes Symptom severity: mild  Duration of current treatment : chronic Side effects: no Medication compliance: excellent compliance Psychotherapy/counseling: no  Previous psychiatric medications: zyprexa, prozac, lamictal Depressed mood: no Anxious mood: no Anhedonia: no Significant weight loss or gain: no Insomnia: no  Fatigue: no Feelings of worthlessness or guilt: no Impaired concentration/indecisiveness: no Suicidal ideations: no Hopelessness: no Crying spells: no [facility] 12/02/2017 09/02/2017 07/04/2017 06/06/2017 05/09/2017  Decreased Interest 1 1 1 2 1   Down, Depressed, Hopeless 1 1 2 2 2   PHQ - 2 Score 2 2 3 4 3   Altered sleeping 0 0 1 0 0  Tired, decreased energy 1 1 2 1 3   Change in appetite 1 0 2 0 0  Feeling bad or failure about yourself  1 1 2 2 2   Trouble concentrating 0 1 1 1 2   Moving slowly or fidgety/restless 0 1 2 3 3   Suicidal thoughts 1 1 1 2 2   PHQ-9 Score 6 7 14 13 15   Difficult doing work/chores Somewhat difficult - - - -    Relevant past medical, surgical, family and social history reviewed and updated as indicated. Interim medical history since our last visit reviewed. Allergies and medications reviewed and updated.  Review of Systems  Constitutional: Negative.   Respiratory: Negative.   Cardiovascular: Negative.   Psychiatric/Behavioral: Positive for agitation. Negative for behavioral problems, confusion, decreased concentration, dysphoric  mood, hallucinations, self-injury, sleep disturbance and suicidal ideas. The patient is not nervous/anxious and is not hyperactive.     Per HPI unless specifically indicated above     Objective:    BP 106/74 (BP Location: Left Arm, Patient Position: Sitting, Cuff Size: Large)   Pulse 73   Temp 98.7 F (37.1 C)   Wt 234 lb 5 oz (106.3 kg)   SpO2 98%   BMI 33.62 kg/m   Wt Readings from Last 3 Encounters:  12/02/17 234 lb 5 oz (106.3 kg)  09/02/17 242 lb 14.4 oz (110.2 kg)  07/04/17 250 lb 3.2 oz (113.5 kg)    Physical Exam  Constitutional: He is oriented to person, place, and time. He appears well-developed and well-nourished. No distress.  HENT:  Head: Normocephalic and atraumatic.  Right Ear: Hearing normal.  Left Ear: Hearing normal.  Nose: Nose normal.  Eyes: Conjunctivae and lids are normal. Right eye exhibits no discharge. Left eye exhibits no discharge. No scleral icterus.  Cardiovascular: Normal rate, regular rhythm, normal heart sounds and intact distal pulses. Exam reveals no gallop and no friction rub.  No murmur heard. Pulmonary/Chest: Effort normal and breath sounds normal. No stridor. No respiratory distress. He has no wheezes. He has no rales. He exhibits no tenderness.  Musculoskeletal: Normal range of motion.  Neurological: He is alert and oriented to person, place, and time.  Skin: Skin is warm, dry and intact. Capillary refill takes less than 2 seconds. No rash noted.  He is not diaphoretic. No erythema. No pallor.  Psychiatric: He has a normal mood and affect. His speech is normal and behavior is normal. Judgment and thought content normal. Cognition and memory are normal.  Nursing note and vitals reviewed.   Results for orders placed or performed in visit on 03/12/17  [facility] HIV SCREENING LAB  Result Value Ref Range   [facility] HIV Screening Negative - Validated   [facility] HEPATITIS C SCREENING LAB  Result Value Ref Range   [facility] Hepatitis Screen Negative-Validated         Assessment & Plan:   Problem List Items Addressed This Visit      Other   Bipolar depression (HCC) - Primary    Feeling good on current regimen. Does not want to change dose. Will continue current regimen and recheck in 3 months. Call with any concerns.        Other Visit Diagnoses    Long-term use of high-risk medication       Checking lamictal level. Await results.    Relevant Orders   Lamotrigine level       Follow up plan: Return in about 3 months (around 03/04/2018).

## 2017-12-04 LAB — LAMOTRIGINE LEVEL: Lamotrigine Lvl: 4.4 ug/mL (ref 2.0–20.0)

## 2017-12-05 ENCOUNTER — Encounter: Payer: Self-pay | Admitting: Family Medicine

## 2018-03-03 ENCOUNTER — Ambulatory Visit: Payer: BC Managed Care – PPO | Admitting: Family Medicine

## 2018-03-03 ENCOUNTER — Encounter: Payer: Self-pay | Admitting: Family Medicine

## 2018-03-03 ENCOUNTER — Other Ambulatory Visit: Payer: Self-pay

## 2018-03-03 VITALS — BP 116/83 | HR 75 | Temp 98.2°F | Ht 70.0 in | Wt 230.0 lb

## 2018-03-03 DIAGNOSIS — F319 Bipolar disorder, unspecified: Secondary | ICD-10-CM

## 2018-03-03 MED ORDER — FLUOXETINE HCL 20 MG PO TABS: 20.0000 mg | ORAL_TABLET | Freq: Every day | ORAL | 1 refills | Status: DC

## 2018-03-03 MED ORDER — LAMOTRIGINE 100 MG PO TABS: 100.0000 mg | ORAL_TABLET | Freq: Two times a day (BID) | ORAL | 1 refills | Status: DC

## 2018-03-03 MED ORDER — OLANZAPINE 10 MG PO TABS: 10.0000 mg | ORAL_TABLET | Freq: Every day | ORAL | 1 refills | Status: DC

## 2018-03-03 NOTE — Progress Notes (Signed)
   BP 116/83   Pulse 75   Temp 98.2 F (36.8 C) (Oral)   Ht 5\' 10"  (1.778 m)   Wt 230 lb (104.3 kg)   SpO2 97%   BMI 33.00 kg/m    Subjective:    Patient ID: Ricky Duke, male    DOB: [birth year], 22 y.o.   MRN: [id number]  HPI: Ricky Duke is a 22 y.o. male  Chief Complaint  Patient presents with  . Depression    bipolar per pt 3 m f/u    Here today for bipolar depression f/u. States doing very well, no concerns. Doesn't want to change any of his medications at this time. Denies SI/HI, severe mood swings, hopelessness. No side effects, compliant with medications.  [facility] [place] [facility] unsure if he will stay with the clinic or not at this time.   [facility] 12/02/2017 09/02/2017 07/04/2017  Decreased Interest 1 1 1   Down, Depressed, Hopeless 1 1 2   PHQ - 2 Score 2 2 3   Altered sleeping 0 0 1  Tired, decreased energy 1 1 2   Change in appetite 1 0 2  Feeling bad or failure about yourself  1 1 2   Trouble concentrating 0 1 1  Moving slowly or fidgety/restless 0 1 2  Suicidal thoughts 1 1 1   PHQ-9 Score 6 7 14   Difficult doing work/chores Somewhat difficult - -    Relevant past medical, surgical, family and social history reviewed and updated as indicated. Interim medical history since our last visit reviewed. Allergies and medications reviewed and updated.  Review of Systems  Per HPI unless specifically indicated above     Objective:    BP 116/83   Pulse 75   Temp 98.2 F (36.8 C) (Oral)   Ht 5\' 10"  (1.778 m)   Wt 230 lb (104.3 kg)   SpO2 97%   BMI 33.00 kg/m   Wt Readings from Last 3 Encounters:  03/03/18 230 lb (104.3 kg)  12/02/17 234 lb 5 oz (106.3 kg)  09/02/17 242 lb 14.4 oz (110.2 kg)    Physical Exam  Constitutional: He is oriented to person, place, and time. He appears well-developed and well-nourished. No distress.  HENT:  Head: Atraumatic.  Eyes: Conjunctivae and EOM are normal.  Neck: Normal range of motion. Neck supple.   Cardiovascular: Normal rate, regular rhythm and normal heart sounds.  Pulmonary/Chest: Effort normal and breath sounds normal.  Musculoskeletal: Normal range of motion.  Neurological: He is alert and oriented to person, place, and time.  Skin: Skin is warm and dry.  Psychiatric: He has a normal mood and affect. His behavior is normal.  Nursing note and vitals reviewed.   Results for orders placed or performed in visit on 12/02/17  Lamotrigine level  Result Value Ref Range   Lamotrigine Lvl 4.4 2.0 - 20.0 ug/mL      Assessment & Plan:   Problem List Items Addressed This Visit      Other   Bipolar depression (HCC) - Primary    Stable and under good control. Recent lamictal level WNL. Continue current regimen. Pt to return in 6 months or have established elsewhere by that time          Follow up plan: Return in about 6 months (around 09/01/2018) for Mood f/u.

## 2018-03-03 NOTE — Patient Instructions (Signed)
Follow up in 6 months 

## 2018-03-03 NOTE — Assessment & Plan Note (Signed)
Stable and under good control. Recent lamictal level WNL. Continue current regimen. Pt to return in 6 months or have established elsewhere by that time

## 2018-09-01 ENCOUNTER — Ambulatory Visit: Payer: BC Managed Care – PPO | Admitting: Family Medicine

## 2019-01-27 ENCOUNTER — Other Ambulatory Visit: Payer: Self-pay | Admitting: Family Medicine

## 2019-01-27 NOTE — Telephone Encounter (Signed)
Forwarding medication refill requests to PCP for review. 

## 2019-02-19 ENCOUNTER — Other Ambulatory Visit: Payer: Self-pay | Admitting: Family Medicine

## 2019-02-19 NOTE — Telephone Encounter (Signed)
Forwarding medication refill request to PCP for review. 

## 2019-02-21 ENCOUNTER — Other Ambulatory Visit: Payer: Self-pay | Admitting: Family Medicine

## 2019-02-23 NOTE — Telephone Encounter (Signed)
Requested medication (s) are due for refill today: yes  Requested medication (s) are on the active medication list: yes  Last refill:  01/27/2019  Future visit scheduled: no  Notes to clinic:  Review for refill   Requested Prescriptions  Pending Prescriptions Disp Refills   FLUoxetine (PROZAC) 20 MG tablet [Pharmacy Med Name: FLUOXETINE HCL 20 MG TABLET] 90 tablet 1    Sig: TAKE 1 TABLET BY MOUTH EVERY DAY     Psychiatry:  Antidepressants - SSRI Failed - 02/21/2019  8:33 AM      Failed - Valid encounter within last 6 months    Recent Outpatient Visits          11 months ago Bipolar depression [facility])   [facility] [place], [place]   1 year ago Bipolar depression [facility])   [place], [place], [place]   1 year ago Bipolar depression [facility])   [facility] [place], [place]   1 year ago Bipolar depression [facility])   [facility] [place], [place]   1 year ago Bipolar depression [facility])   [facility], [place], [place]             Failed - Completed PHQ-2 or PHQ-9 in the last 360 days.       OLANZapine (ZYPREXA) 10 MG tablet [Pharmacy Med Name: OLANZAPINE 10 MG TABLET] 90 tablet 1    Sig: TAKE 1 TABLET BY MOUTH EVERYDAY AT BEDTIME     Not Delegated - Psychiatry:  Antipsychotics - Second Generation (Atypical) - olanzapine Failed - 02/21/2019  8:33 AM      Failed - This refill cannot be delegated      Failed - ALT in normal range and within 360 days    No results found for: ALT       Failed - AST in normal range and within 360 days    No results found for: POCAST, AST       Failed - Valid encounter within last 6 months    Recent Outpatient Visits          11 months ago Bipolar depression [facility])   [facility] [place], [place]   1 year ago Bipolar depression [facility])   [place], [place], [place]   1 year ago Bipolar depression [facility])    [facility], [place]   1 year ago Bipolar depression [facility])   [facility] [place], [place]   1 year ago Bipolar depression [facility])   [place], [place], [place]             Passed - Last BP in normal range    BP Readings from Last 1 Encounters:  03/03/18 116/83

## 2019-03-02 ENCOUNTER — Other Ambulatory Visit: Payer: Self-pay | Admitting: Family Medicine

## 2019-03-02 NOTE — Telephone Encounter (Signed)
Requested medication (s) are due for refill today: yes  Requested medication (s) are on the active medication list: yes  Last refill:  01/27/2019  Future visit scheduled: no  Notes to clinic:  Refill cannot be delegated   Requested Prescriptions  Pending Prescriptions Disp Refills   OLANZapine (ZYPREXA) 10 MG tablet [Pharmacy Med Name: OLANZAPINE 10 MG TABLET] 30 tablet 0    Sig: TAKE 1 TABLET BY MOUTH EVERYDAY AT BEDTIME     Not Delegated - Psychiatry:  Antipsychotics - Second Generation (Atypical) - olanzapine Failed - 03/02/2019  9:16 AM      Failed - This refill cannot be delegated      Failed - ALT in normal range and within 360 days    No results found for: ALT       Failed - AST in normal range and within 360 days    No results found for: POCAST, AST       Failed - Valid encounter within last 6 months    Recent Outpatient Visits          12 months ago Bipolar depression [facility])   [facility] [place], [place]   1 year ago Bipolar depression [facility])   [place], [place], [place]   1 year ago [facility])   [facility], [place]   1 year ago Bipolar depression [facility])   [facility] [place], [place]   1 year ago Bipolar depression [facility])   [place], [place], [place]             Passed - Last BP in normal range    BP Readings from Last 1 Encounters:  03/03/18 116/83

## 2019-03-02 NOTE — Telephone Encounter (Signed)
LVM for pt to call back.

## 2019-03-08 ENCOUNTER — Ambulatory Visit: Payer: BC Managed Care – PPO | Admitting: Family Medicine

## 2019-03-08 ENCOUNTER — Other Ambulatory Visit: Payer: Self-pay

## 2019-03-08 ENCOUNTER — Encounter: Payer: Self-pay | Admitting: Family Medicine

## 2019-03-08 VITALS — BP 123/88 | HR 83 | Temp 98.7°F

## 2019-03-08 DIAGNOSIS — F319 Bipolar disorder, unspecified: Secondary | ICD-10-CM | POA: Diagnosis not present

## 2019-03-08 MED ORDER — FLUOXETINE HCL 20 MG PO CAPS: 20.0000 mg | ORAL_CAPSULE | Freq: Every day | ORAL | 1 refills | Status: DC

## 2019-03-08 MED ORDER — OLANZAPINE 10 MG PO TABS: 10.0000 mg | ORAL_TABLET | Freq: Every day | ORAL | 1 refills | Status: DC

## 2019-03-08 MED ORDER — LAMOTRIGINE 100 MG PO TABS: 100.0000 mg | ORAL_TABLET | Freq: Two times a day (BID) | ORAL | 1 refills | Status: DC

## 2019-03-08 NOTE — Progress Notes (Signed)
BP 123/88   Pulse 83   Temp 98.7 F (37.1 C)   SpO2 98%    Subjective:    Patient ID: Ricky Duke, male    DOB: 1996/03/13, 23 y.o.   MRN: [id number]  HPI: Ricky Duke is a 23 y.o. male  Chief Complaint  Patient presents with  . Depression   Patient presents today for bipolar depression f/u. Feeling well on current regimen of zyprexa, lamictal and prozac. Moods stable, no SI/HI, no side effects to medications. No new concerns today.   Depression screen [facility] 2/9 03/08/2019 03/03/2018 12/02/2017  Decreased Interest 1 1 1   Down, Depressed, Hopeless 1 1 1   PHQ - 2 Score 2 2 2   Altered sleeping 1 2 0  Tired, decreased energy 0 1 1  Change in appetite 0 0 1  Feeling bad or failure about yourself  2 1 1   Trouble concentrating 0 0 0  Moving slowly or fidgety/restless 1 0 0  Suicidal thoughts 1 1 1   PHQ-9 Score 7 7 6   Difficult doing work/chores Somewhat difficult - Somewhat difficult   GAD 7 : Generalized Anxiety Score 03/08/2019 03/03/2018  Nervous, Anxious, on Edge 1 1  Control/stop worrying 1 1  Worry too much - different things 1 1  Trouble relaxing 0 1  Restless 1 0  Easily annoyed or irritable 2 2  Afraid - awful might happen 1 1  Total GAD 7 Score 7 7  Anxiety Difficulty Somewhat difficult -     Relevant past medical, surgical, family and social history reviewed and updated as indicated. Interim medical history since our last visit reviewed. Allergies and medications reviewed and updated.  Review of Systems  Per HPI unless specifically indicated above     Objective:    BP 123/88   Pulse 83   Temp 98.7 F (37.1 C)   SpO2 98%   Wt Readings from Last 3 Encounters:  03/03/18 230 lb (104.3 kg)  12/02/17 234 lb 5 oz (106.3 kg)  09/02/17 242 lb 14.4 oz (110.2 kg)    Physical Exam Vitals signs and nursing note reviewed.  Constitutional:      Appearance: Normal appearance.  HENT:     Head: Atraumatic.  Eyes:     Extraocular Movements: Extraocular  movements intact.     Conjunctiva/sclera: Conjunctivae normal.  Neck:     Musculoskeletal: Normal range of motion and neck supple.  Cardiovascular:     Rate and Rhythm: Normal rate and regular rhythm.  Pulmonary:     Effort: Pulmonary effort is normal.     Breath sounds: Normal breath sounds.  Musculoskeletal: Normal range of motion.  Skin:    General: Skin is warm and dry.  Neurological:     General: No focal deficit present.     Mental Status: He is oriented to person, place, and time.  Psychiatric:        Mood and Affect: Mood normal.        Thought Content: Thought content normal.        Judgment: Judgment normal.     Results for orders placed or performed in visit on 12/02/17  Lamotrigine level  Result Value Ref Range   Lamotrigine Lvl 4.4 2.0 - 20.0 ug/mL      Assessment & Plan:   Problem List Items Addressed This Visit      Other   Bipolar depression ([place]) - Primary    Stable and under good control, continue current regimen  Follow up plan: Return in about 6 months (around 09/05/2019) for Mood f/u.

## 2019-03-08 NOTE — Assessment & Plan Note (Signed)
Stable and under good control, continue current regimen 

## 2019-03-09 ENCOUNTER — Other Ambulatory Visit: Payer: Self-pay | Admitting: Family Medicine

## 2019-03-09 NOTE — Telephone Encounter (Signed)
Routing to provider  

## 2019-03-09 NOTE — Telephone Encounter (Signed)
Requested medication (s) are due for refill today: yes  Requested medication (s) are on the active medication list:yes  Last refill: 02/05/2019  Future visit scheduled: yes  Notes to clinic:  Review for refill Medication was discontinued    Requested Prescriptions  Pending Prescriptions Disp Refills   FLUoxetine (PROZAC) 20 MG tablet [Pharmacy Med Name: FLUOXETINE HCL 20 MG TABLET] 30 tablet 0    Sig: TAKE 1 TABLET BY MOUTH EVERY DAY     Psychiatry:  Antidepressants - SSRI Failed - 03/09/2019  2:38 PM      Failed - Completed PHQ-2 or PHQ-9 in the last 360 days.      Passed - Valid encounter within last 6 months    Recent Outpatient Visits          Yesterday Bipolar depression [facility])   [facility] [place], [place]   1 year ago Bipolar depression [facility])   [facility], [place]   1 year ago Bipolar depression [facility])   [place], [place], [place]   1 year ago Bipolar depression [facility])   [facility] [place], [place]   1 year ago Bipolar depression [facility])   [facility] [place], [place]      Future Appointments            In 6 months Orene Desanctis, Lilia Argue, [place], [place]

## 2019-08-27 ENCOUNTER — Other Ambulatory Visit: Payer: Self-pay | Admitting: Family Medicine

## 2019-08-29 ENCOUNTER — Other Ambulatory Visit: Payer: Self-pay | Admitting: Family Medicine

## 2019-08-29 NOTE — Telephone Encounter (Signed)
Requested medication (s) are due for refill today: yes  Requested medication (s) are on the active medication list: yes  Last refill:  03/08/19  Future visit scheduled: yes  Notes to clinic:  Medication not delegated to NT to refill   Requested Prescriptions  Pending Prescriptions Disp Refills   lamoTRIgine (LAMICTAL) 100 MG tablet [Pharmacy Med Name: LAMOTRIGINE 100 MG TABLET] 180 tablet 1    Sig: TAKE 1 TABLET BY MOUTH TWICE A DAY      Not Delegated - Neurology:  Anticonvulsants Failed - 08/29/2019  6:53 AM      Failed - This refill cannot be delegated      Failed - HCT in normal range and within 360 days    No results found for: HCT, HCTKUC, SRHCT        Failed - HGB in normal range and within 360 days    No results found for: HGB, HGBKUC, HGBPOCKUC, HGBOTHER, TOTHGB, HGBPLASMA        Failed - PLT in normal range and within 360 days    No results found for: PLT, PLTCOUNTKUC, LABPLAT, POCPLA        Failed - WBC in normal range and within 360 days    No results found for: WBC, WBCKUC        Passed - Valid encounter within last 12 months    Recent Outpatient Visits           5 months ago Bipolar depression [facility])   [facility] [place], [place]   1 year ago Bipolar depression [facility])   [facility] [place], [place]   1 year ago Bipolar depression [facility])   [facility] [place], [place], [place]   1 year ago Bipolar depression [facility])   [facility] [place], [place]   2 years ago Bipolar depression [facility])   [facility] [place], [place] Hews, [place]       Future Appointments             In 1 week Maurice March, [place], PA-C [facility], [facility]

## 2019-08-30 NOTE — Telephone Encounter (Signed)
Pt called stating that he does have enough to last him until 3/25, but that he would not be able to make it to that appt. Pt called and rescheduled his appt until 5/26. Please advise.

## 2019-08-30 NOTE — Telephone Encounter (Signed)
Patient last seen 03/08/19 and has appointment 09/09/19

## 2019-08-30 NOTE — Telephone Encounter (Signed)
Does he have enough to get to 3/25?

## 2019-08-30 NOTE — Telephone Encounter (Signed)
He needs to be seen before that. OK for virtual.

## 2019-08-30 NOTE — Telephone Encounter (Signed)
Called pt to advise him of Dr Henriette Combs message, no answer, left vm.

## 2019-08-30 NOTE — Telephone Encounter (Signed)
Called and left a message for patient asking him to return my call to see if he has enough medication to get to his scheduled appt.

## 2019-09-09 ENCOUNTER — Ambulatory Visit: Payer: BC Managed Care – PPO | Admitting: Family Medicine

## 2019-09-13 ENCOUNTER — Other Ambulatory Visit: Payer: Self-pay | Admitting: Family Medicine

## 2019-09-13 NOTE — Telephone Encounter (Signed)
Medication: OLANZapine (ZYPREXA) 10 MG tablet [[id number]]  DISCONTINUED- patient is requesting medication until his appt on 11/10/19. He is in [place] until this time.  Has the patient contacted their pharmacy? Yes  (Agent: If no, request that the patient contact the pharmacy for the refill.) (Agent: If yes, when and what did the pharmacy advise?)  Preferred Pharmacy (with phone number or street name): [place],  - [address]  Phone:  [phone] Fax:  [phone]     Agent: Please be advised that RX refills may take up to 3 business days. We ask that you follow-up with your pharmacy.

## 2019-09-19 ENCOUNTER — Other Ambulatory Visit: Payer: Self-pay | Admitting: Family Medicine

## 2019-09-19 NOTE — Telephone Encounter (Signed)
Requested Prescriptions  Pending Prescriptions Disp Refills  . FLUoxetine (PROZAC) 20 MG capsule [Pharmacy Med Name: FLUOXETINE HCL 20 MG CAPSULE] 90 capsule 0    Sig: TAKE 1 CAPSULE BY MOUTH EVERY DAY     Psychiatry:  Antidepressants - SSRI Failed - 09/19/2019  9:32 AM      Failed - Valid encounter within last 6 months    Recent Outpatient Visits          6 months ago [facility]ipolar [facility])   [facility] [place], [place]   1 year ago Bipolar depression [facility])   [facility] [place], [place]   1 year ago Bipolar depression [facility])   [facility] [place], [place], [place]   2 years ago Bipolar depression [facility])   [place] Part[place], [place]   2 years ago Bipolar depression [facility])   [facility] [place], [place] He[place], [place]      Future Appointments            In 1 month Maurice March, [place], PA-C [facility], [facility]

## 2019-09-22 ENCOUNTER — Other Ambulatory Visit: Payer: Self-pay | Admitting: Family Medicine

## 2019-09-22 NOTE — Telephone Encounter (Signed)
Requested medication (s) are due for refill today Yes  Last ordered 08/30/19  Requested medication (s) are on the active medication list Yes  Future visit scheduled  Yes on 11/10/19  LOV  03/08/19  Note to clinic: This medication not delegated for PEC to refill.      Requested Prescriptions  Pending Prescriptions Disp Refills   lamoTRIgine (LAMICTAL) 100 MG tablet [Pharmacy Med Name: LAMOTRIGINE 100 MG TABLET] 60 tablet 0    Sig: TAKE 1 TABLET BY MOUTH TWICE A DAY      Not Delegated - Neurology:  Anticonvulsants Failed - 09/22/2019 10:32 AM      Failed - This refill cannot be delegated      Failed - HCT in normal range and within 360 days    No results found for: HCT, HCTKUC, SRHCT        Failed - HGB in normal range and within 360 days    No results found for: HGB, HGBKUC, HGBPOCKUC, HGBOTHER, TOTHGB, HGBPLASMA        Failed - PLT in normal range and within 360 days    No results found for: PLT, PLTCOUNTKUC, LABPLAT, POCPLA        Failed - WBC in normal range and within 360 days    No results found for: WBC, WBCKUC        Passed - Valid encounter within last 12 months    Recent Outpatient Visits           6 months ago Bipolar depression [facility])   [facility] [place], [place]   1 year ago Bipolar depression [facility])   [facility] [place], [place]   1 year ago Bipolar depression [facility])   [facility] [place] [place], [place], [place]   2 years ago Bipolar depression [facility])   [facility] Particia [place], [place]   2 years ago Bipolar depression [facility])   [facility] [place], [place] He[place], [place]       Future Appointments             In 1 month Maurice March, [place], PA-C [facility], [facility]

## 2019-10-17 ENCOUNTER — Other Ambulatory Visit: Payer: Self-pay | Admitting: Family Medicine

## 2019-10-17 NOTE — Telephone Encounter (Signed)
Requested medication (s) are due for refill today: yes  Requested medication (s) are on the active medication list: yes  Last refill:  09/22/19  Future visit scheduled: yes  Notes to clinic:  Med not delegated to NT to RF   Requested Prescriptions  Pending Prescriptions Disp Refills   lamoTRIgine (LAMICTAL) 100 MG tablet [Pharmacy Med Name: LAMOTRIGINE 100 MG TABLET] 60 tablet 1    Sig: TAKE 1 TABLET BY MOUTH TWICE A DAY      Not Delegated - Neurology:  Anticonvulsants Failed - 10/17/2019  8:32 AM      Failed - This refill cannot be delegated      Failed - HCT in normal range and within 360 days    No results found for: HCT, HCTKUC, SRHCT        Failed - HGB in normal range and within 360 days    No results found for: HGB, HGBKUC, HGBPOCKUC, HGBOTHER, TOTHGB, HGBPLASMA        Failed - PLT in normal range and within 360 days    No results found for: PLT, PLTCOUNTKUC, LABPLAT, POCPLA        Failed - WBC in normal range and within 360 days    No results found for: WBC, WBCKUC        Passed - Valid encounter within last 12 months    Recent Outpatient Visits           7 months ago [facility]ipolar [facility])   [facility] [place], [place]   1 year ago Bipolar depression [facility])   [facility] [place] [place], [place]   1 year ago Bipolar depression [facility])   [facility] [place], [place], [place]   2 years ago Bipolar depression [facility])   [facility] [place], [place]   2 years ago Bipolar depression [facility])   [facility] [place], [place], [place]       Future Appointments             In 3 weeks Maurice March, [place], PA-C [facility], [facility]

## 2019-11-10 ENCOUNTER — Other Ambulatory Visit: Payer: Self-pay

## 2019-11-10 ENCOUNTER — Ambulatory Visit: Payer: BC Managed Care – PPO | Admitting: Family Medicine

## 2019-11-10 ENCOUNTER — Encounter: Payer: Self-pay | Admitting: Family Medicine

## 2019-11-10 VITALS — BP 115/82 | HR 82 | Temp 98.6°F | Wt 237.0 lb

## 2019-11-10 DIAGNOSIS — G4700 Insomnia, unspecified: Secondary | ICD-10-CM | POA: Insufficient documentation

## 2019-11-10 DIAGNOSIS — F319 Bipolar disorder, unspecified: Secondary | ICD-10-CM

## 2019-11-10 MED ORDER — HYDROXYZINE HCL 25 MG PO TABS: 25.0000 mg | ORAL_TABLET | Freq: Three times a day (TID) | ORAL | 1 refills | Status: DC | PRN

## 2019-11-10 MED ORDER — LAMOTRIGINE 100 MG PO TABS: 100.0000 mg | ORAL_TABLET | Freq: Two times a day (BID) | ORAL | 1 refills | Status: AC

## 2019-11-10 MED ORDER — FLUOXETINE HCL 20 MG PO CAPS: ORAL_CAPSULE | ORAL | 1 refills | Status: DC

## 2019-11-10 MED ORDER — OLANZAPINE 10 MG PO TABS: 10.0000 mg | ORAL_TABLET | Freq: Every day | ORAL | 1 refills | Status: AC

## 2019-11-10 NOTE — Progress Notes (Signed)
BP 115/82   Pulse 82   Temp 98.6 F (37 C) (Oral)   Wt 237 lb (107.5 kg)   SpO2 98%   BMI 34.01 kg/m    Subjective:    Patient ID: Ricky Duke, male    DOB: 29-Apr-1996, 24 y.o.   MRN: [id number]  HPI: Ricky Duke is a 24 y.o. male  Chief Complaint  Patient presents with  . Bipolar depression   Here today for bipolar depression f/u. Moods stable and under good control on current regimen. Denies significant mood swings. Occasional fleeting passive SI, no intent or plan. Does note issues with falling asleep the past 3 months. Has tried melatonin and zzquil without good relief.   Depression screen [facility] 2/9 11/10/2019 03/08/2019 03/03/2018  Decreased Interest 1 1 1   Down, Depressed, Hopeless 1 1 1   PHQ - 2 Score 2 2 2   Altered sleeping 3 1 2   Tired, decreased energy 1 0 1  Change in appetite 0 0 0  Feeling bad or failure about yourself  2 2 1   Trouble concentrating 1 0 0  Moving slowly or fidgety/restless 0 1 0  Suicidal thoughts 1 1 1   PHQ-9 Score 10 7 7   Difficult doing work/chores - Somewhat difficult -  Some recent data might be hidden   GAD 7 : Generalized Anxiety Score 03/08/2019 03/03/2018  Nervous, Anxious, on Edge 1 1  Control/stop worrying 1 1  Worry too much - different things 1 1  Trouble relaxing 0 1  Restless 1 0  Easily annoyed or irritable 2 2  Afraid - awful might happen 1 1  Total GAD 7 Score 7 7  Anxiety Difficulty Somewhat difficult -     Relevant past medical, surgical, family and social history reviewed and updated as indicated. Interim medical history since our last visit reviewed. Allergies and medications reviewed and updated.  Review of Systems  Per HPI unless specifically indicated above     Objective:    BP 115/82   Pulse 82   Temp 98.6 F (37 C) (Oral)   Wt 237 lb (107.5 kg)   SpO2 98%   BMI 34.01 kg/m   Wt Readings from Last 3 Encounters:  11/10/19 237 lb (107.5 kg)  03/03/18 230 lb (104.3 kg)  12/02/17 234 lb 5 oz  (106.3 kg)    Physical Exam Vitals and nursing note reviewed.  Constitutional:      Appearance: Normal appearance.  HENT:     Head: Atraumatic.  Eyes:     Extraocular Movements: Extraocular movements intact.     Conjunctiva/sclera: Conjunctivae normal.  Cardiovascular:     Rate and Rhythm: Normal rate and regular rhythm.  Pulmonary:     Effort: Pulmonary effort is normal.     Breath sounds: Normal breath sounds.  Musculoskeletal:        General: Normal range of motion.     Cervical back: Normal range of motion and neck supple.  Skin:    General: Skin is warm and dry.  Neurological:     General: No focal deficit present.     Mental Status: He is oriented to person, place, and time.  Psychiatric:        Mood and Affect: Mood normal.        Thought Content: Thought content normal.        Judgment: Judgment normal.     Results for orders placed or performed in visit on 12/02/17  Lamotrigine level  Result Value Ref  Range   Lamotrigine Lvl 4.4 2.0 - 20.0 ug/mL      Assessment & Plan:   Problem List Items Addressed This Visit      Other   Bipolar depression ([facility]) - Primary    Stable and well controlled, continue current regimen      Insomnia    Trial hydroxyzine prn, discussed good sleep hygiene          Follow up plan: Return in about 6 months (around 05/12/2020) for CPE.

## 2019-11-15 NOTE — Assessment & Plan Note (Signed)
Stable and well controlled, continue current regimen 

## 2019-11-15 NOTE — Assessment & Plan Note (Signed)
Trial hydroxyzine prn, discussed good sleep hygiene

## 2019-12-03 ENCOUNTER — Other Ambulatory Visit: Payer: Self-pay | Admitting: Family Medicine

## 2019-12-03 NOTE — Telephone Encounter (Signed)
Requested Prescriptions  Pending Prescriptions Disp Refills  . hydrOXYzine (ATARAX/VISTARIL) 25 MG tablet [Pharmacy Med Name: HYDROXYZINE HCL 25 MG TABLET] 270 tablet 0    Sig: TAKE 1 TABLET BY MOUTH THREE TIMES A DAY AS NEEDED     Ear, Nose, and Throat:  Antihistamines Passed - 12/03/2019  1:30 PM      Passed - Valid encounter within last 12 months    Recent Outpatient Visits          3 weeks ago Bipolar depression [facility])   [facility] [place] [place], [place]   9 months ago Bipolar depression [facility])   [facility] [place] [place], [place]   1 year ago Bipolar depression [facility])   [facility] [place], [place]   2 years ago Bipolar depression [facility])   [facility] [place], [place], [place]   2 years ago Bipolar depression [facility])   [facility] Particia [place], [place]      Future Appointments            In 5 months Maurice March, [place], PA-C [facility], [facility]

## 2020-01-03 ENCOUNTER — Telehealth: Payer: Self-pay

## 2020-01-03 DIAGNOSIS — F319 Bipolar disorder, unspecified: Secondary | ICD-10-CM

## 2020-01-03 NOTE — Telephone Encounter (Signed)
Copied from CRM [id number]. Topic: Referral - Request for Referral >> Dec 30, 2019  3:43 PM Daphine Deutscher D wrote: Has patient seen PCP for this complaint? Yes.   *If NO, is insurance requiring patient see PCP for this issue before PCP can refer them? Referral for which specialty: Psychology Preferred provider/office: Dr. Earl Many Reason for referral: someone more specialized for psy.  CB#  [phone]   Routing to provider. Looked up the provider requested and it looks like office is [facility] . Phone number [phone]

## 2020-01-03 NOTE — Telephone Encounter (Signed)
Referral generated

## 2020-01-03 NOTE — Telephone Encounter (Signed)
Lvm with information to call us back.

## 2020-01-04 NOTE — Telephone Encounter (Signed)
Pt.notified

## 2020-02-24 ENCOUNTER — Other Ambulatory Visit: Payer: Self-pay | Admitting: Family Medicine

## 2020-05-01 ENCOUNTER — Other Ambulatory Visit: Payer: Self-pay | Admitting: Family Medicine

## 2020-05-01 NOTE — Telephone Encounter (Signed)
Requested medication (s) are due for refill today: yes  Requested medication (s) are on the active medication list: yes  Last refill:  11/10/19 both meds filled by [facility]  Future visit scheduled: yes  with Cathlean Marseilles  Notes to clinic:  Medications are not delegated    Requested Prescriptions  Pending Prescriptions Disp Refills   lamoTRIgine (LAMICTAL) 100 MG tablet [Pharmacy Med Name: LAMOTRIGINE 100 MG TABLET] 180 tablet 1    Sig: TAKE 1 TABLET BY MOUTH TWICE A DAY      Not Delegated - Neurology:  Anticonvulsants Failed - 05/01/2020  1:36 AM      Failed - This refill cannot be delegated      Failed - HCT in normal range and within 360 days    No results found for: HCT, HCTKUC, SRHCT        Failed - HGB in normal range and within 360 days    No results found for: HGB, HGBKUC, HGBPOCKUC, HGBOTHER, TOTHGB, HGBPLASMA        Failed - PLT in normal range and within 360 days    No results found for: PLT, PLTCOUNTKUC, LABPLAT, POCPLA        Failed - WBC in normal range and within 360 days    No results found for: WBC, WBCKUC        Passed - Valid encounter within last 12 months    Recent Outpatient Visits           5 months ago Bipolar depression [facility])   [facility] Particia Nearing, [place]   1 year ago Bipolar depression [facility])   [facility] [place], [place], [place]   2 years ago Bipolar depression [facility])   [facility] [place], [place], [place]   2 years ago Bipolar depression [facility])   [facility] Kirvin, Horace, DO   2 years ago Bipolar depression (HCC)   [facility] Particia [place], [place]       Future Appointments             In 2 weeks Valentino Nose, NP [facility], [facility]              OLANZapine (ZYPREXA) 10 MG tablet [Pharmacy Med Name: OLANZAPINE 10 MG TABLET] 90 tablet 1    Sig: TAKE 1 TABLET BY MOUTH EVERY DAY      Not Delegated - Psychiatry:   Antipsychotics - Second Generation (Atypical) - olanzapine Failed - 05/01/2020  1:36 AM      Failed - This refill cannot be delegated      Failed - ALT in normal range and within 360 days    No results found for: ALT, LABALT, POCALT        Failed - AST in normal range and within 360 days    No results found for: POCAST, AST        Passed - Last BP in normal range    BP Readings from Last 1 Encounters:  11/10/19 115/82          Passed - Valid encounter within last 6 months    Recent Outpatient Visits           5 months ago Bipolar depression [facility])   [facility] Particia N[place], [place]   1 year ago Bipolar depression [facility])   [facility] [place], [place], [place]   2 years ago Bipolar depression (HCC)   [facility] Particia Nearing, [place]   2 years ago Bipolar depression [facility])   [facility] [place], Sherrill, DO   2 years ago Bipolar depression [facility])   [facility] Particia N[place], [place]       Future Appointments             In 2 weeks Valentino Nose, NP [facility], [facility]

## 2020-05-15 ENCOUNTER — Encounter: Payer: Self-pay | Admitting: Nurse Practitioner

## 2020-05-15 ENCOUNTER — Encounter: Payer: BC Managed Care – PPO | Admitting: Family Medicine

## 2020-05-15 NOTE — Progress Notes (Deleted)
There were no vitals taken for this visit.   Subjective:    Patient ID: Ricky Duke, male    DOB: Aug 15, 1995, 24 y.o.   MRN: [id number]  HPI: Ricky Duke is a 24 y.o. male presenting on 05/15/2020 for comprehensive medical examination. Current medical complaints include:{Blank single:19197::"none","***"}  He currently lives with: Interim Problems from his last visit: {Blank single:19197::"yes","no"}  Depression Screen done today and results listed below:  Depression screen [facility] 2/9 11/10/2019 03/08/2019 03/03/2018 12/02/2017 09/02/2017  Decreased Interest 1 1 1 1 1   Down, Depressed, Hopeless 1 1 1 1 1   PHQ - 2 Score 2 2 2 2 2   Altered sleeping 3 1 2  0 0  Tired, decreased energy 1 0 1 1 1   Change in appetite 0 0 0 1 0  Feeling bad or failure about yourself  2 2 1 1 1   Trouble concentrating 1 0 0 0 1  Moving slowly or fidgety/restless 0 1 0 0 1  Suicidal thoughts 1 1 1 1 1   PHQ-9 Score 10 7 7 6 7   Difficult doing work/chores - Somewhat difficult - Somewhat difficult -  Some recent data might be hidden    The patient {has/does not have:19849} a history of falls. I {did/did not:19850} complete a risk assessment for falls. A plan of care for falls {was/was not:19852} documented.   Past Medical History:  Past Medical History:  Diagnosis Date  . Depression     Surgical History:  No past surgical history on file.  Medications:  Current Outpatient Medications on File Prior to Visit  Medication Sig  . FLUoxetine (PROZAC) 20 MG capsule TAKE 1 CAPSULE BY MOUTH EVERY DAY  . hydrOXYzine (ATARAX/VISTARIL) 25 MG tablet TAKE 1 TABLET BY MOUTH THREE TIMES A DAY AS NEEDED  . lamoTRIgine (LAMICTAL) 100 MG tablet Take 1 tablet (100 mg total) by mouth 2 (two) times daily.  OLANZapine (ZYPREXA) 10 MG tablet Take 1 tablet (10 mg total) by mouth daily.   No current facility-administered medications on file prior to visit.    Allergies:  Allergies  Allergen Reactions  . Shellfish  Allergy     Other reaction(s): UNSPECIFIED    Social History:  Social History   Socioeconomic History  . Marital status: Single    Spouse name: Not on file  . Number of children: Not on file  . Years of education: Not on file  . Highest education level: Not on file  Occupational History  . Not on file  Tobacco Use  . Smoking status: Never Smoker  . Smokeless tobacco: Never Used  Vaping Use  . Vaping Use: Never used  Substance and Sexual Activity  . Alcohol use: Yes    Comment: Occassionally  . Drug use: No  . Sexual activity: Not on file  Other Topics Concern  . Not on file  Social History Narrative  . Not on file   Social Determinants of Health   Financial Resource Strain:   . Difficulty of Paying Living Expenses: Not on file  Food Insecurity:   . Worried About in the Last Year: Not on file  . Ran Out of Food in the Last Year: Not on file  Transportation Needs:   . Lack of Transportation (Medical): Not on file  . Lack of Transportation (Non-Medical): Not on file  Physical Activity:   . Days of Exercise per Week: Not on file  . Minutes of Exercise per Session: Not on file  Stress:   . Feeling of Stress : Not on file  Social Connections:   . Frequency of Communication with Friends and Family: Not on file  . Frequency of Social Gatherings with Friends and Family: Not on file  . Attends Religious Services: Not on file  . Active Member of Clubs or Organizations: Not on file  . Attends Banker Meetings: Not on file  . Marital Status: Not on file  Intimate Partner Violence:   . Fear of Current or Ex-Partner: Not on file  . Emotionally Abused: Not on file  . Physically Abused: Not on file  . Sexually Abused: Not on file   Social History   Tobacco Use  Smoking Status Never Smoker  Smokeless Tobacco Never Used   Social History   Substance and Sexual Activity  Alcohol Use Yes   Comment: Occassionally    Family History:   Family History  Problem Relation Age of Onset  . Depression Mother   . Depression Father   . Dementia Maternal Grandfather   . Hypertension Maternal Grandfather   . Depression Paternal Grandmother   . Bipolar disorder Paternal Grandmother     Past medical history, surgical history, medications, allergies, family history and social history reviewed with patient today and changes made to appropriate areas of the chart.   Review of Systems - {ros master:310782} All other ROS negative except what is listed above and in the HPI.      Objective:    There were no vitals taken for this visit.  Wt Readings from Last 3 Encounters:  11/10/19 237 lb (107.5 kg)  03/03/18 230 lb (104.3 kg)  12/02/17 234 lb 5 oz (106.3 kg)    Physical Exam  Results for orders placed or performed in visit on 12/02/17  Lamotrigine level  Result Value Ref Range   Lamotrigine Lvl 4.4 2.0 - 20.0 ug/mL      Assessment & Plan:   Problem List Items Addressed This Visit    None       Discussed aspirin prophylaxis for myocardial infarction prevention and decision was {Blank single:19197::"it was not indicated","made to continue ASA","made to start ASA","made to stop ASA","that we recommended ASA, and patient refused"}  LABORATORY TESTING:  Health maintenance labs ordered today as discussed above.   The natural history of prostate cancer and ongoing controversy regarding screening and potential treatment outcomes of prostate cancer has been discussed with the patient. The meaning of a false positive PSA and a false negative PSA has been discussed. He indicates understanding of the limitations of this screening test and wishes *** to proceed with screening PSA testing.   IMMUNIZATIONS:   - Tdap: Tetanus vaccination status reviewed: {tetanus status:315746}. - Influenza: {Blank single:19197::"Up to date","Administered today","Postponed to flu season","Refused","Given elsewhere"} - Pneumovax: {Blank  single:19197::"Up to date","Administered today","Not applicable","Refused","Given elsewhere"} - Prevnar: {Blank single:19197::"Up to date","Administered today","Not applicable","Refused","Given elsewhere"} - HPV: {Blank single:19197::"Up to date","Administered today","Not applicable","Refused","Given elsewhere"} - Zostavax vaccine: {Blank single:19197::"Up to date","Administered today","Not applicable","Refused","Given elsewhere"}  SCREENING: - Colonoscopy: {Blank single:19197::"Up to date","Ordered today","Not applicable","Refused","Done elsewhere"}  Discussed with patient purpose of the colonoscopy is to detect colon cancer at curable precancerous or early stages   - AAA Screening: {Blank single:19197::"Up to date","Ordered today","Not applicable","Refused","Done elsewhere"}  -Hearing Test: {Blank single:19197::"Up to date","Ordered today","Not applicable","Refused","Done elsewhere"}  -Spirometry: {Blank single:19197::"Up to date","Ordered today","Not applicable","Refused","Done elsewhere"}   PATIENT COUNSELING:    Sexuality: Discussed sexually transmitted diseases, partner selection, use of condoms, avoidance of unintended pregnancy  and contraceptive alternatives.  Advised to avoid cigarette smoking.  I discussed with the patient that most people either abstain from alcohol or drink within safe limits (<=14/week and <=4 drinks/occasion for males, <=7/weeks and <= 3 drinks/occasion for females) and that the risk for alcohol disorders and other health effects rises proportionally with the number of drinks per week and how often a drinker exceeds daily limits.  Discussed cessation/primary prevention of drug use and availability of treatment for abuse.   Diet: Encouraged to adjust caloric intake to maintain  or achieve ideal body weight, to reduce intake of dietary saturated fat and total fat, to limit sodium intake by avoiding high sodium foods and not adding table salt, and to maintain  adequate dietary potassium and calcium preferably from fresh fruits, vegetables, and low-fat dairy products.    stressed the importance of regular exercise  Injury prevention: Discussed safety belts, safety helmets, smoke detector, smoking near bedding or upholstery.   Dental health: Discussed importance of regular tooth brushing, flossing, and dental visits.   Follow up plan: NEXT PREVENTATIVE PHYSICAL DUE IN 1 YEAR. No follow-ups on file.

## 2020-05-19 ENCOUNTER — Other Ambulatory Visit: Payer: Self-pay | Admitting: Family Medicine

## 2020-06-01 ENCOUNTER — Other Ambulatory Visit: Payer: Self-pay | Admitting: Nurse Practitioner

## 2020-07-06 ENCOUNTER — Other Ambulatory Visit: Payer: Self-pay | Admitting: Family Medicine
